# Patient Record
Sex: Female | Born: 1985
Health system: Southern US, Community
[De-identification: ages and names within clinical notes are randomized; demographics above are authoritative.]

## PROBLEM LIST (undated history)

## (undated) ENCOUNTER — Inpatient Hospital Stay: Payer: Self-pay

## (undated) DIAGNOSIS — N840 Polyp of corpus uteri: Secondary | ICD-10-CM

## (undated) DIAGNOSIS — N390 Urinary tract infection, site not specified: Secondary | ICD-10-CM

## (undated) DIAGNOSIS — B379 Candidiasis, unspecified: Secondary | ICD-10-CM

## (undated) HISTORY — DX: Polyp of corpus uteri: N84.0

## (undated) HISTORY — PX: DILATION AND CURETTAGE OF UTERUS: SHX78

## (undated) HISTORY — DX: Candidiasis, unspecified: B37.9

## (undated) HISTORY — DX: Urinary tract infection, site not specified: N39.0

---

## 2015-10-05 ENCOUNTER — Encounter: Payer: Self-pay | Admitting: Obstetrics and Gynecology

## 2015-10-18 ENCOUNTER — Ambulatory Visit (INDEPENDENT_AMBULATORY_CARE_PROVIDER_SITE_OTHER): Payer: 59 | Admitting: Obstetrics and Gynecology

## 2015-10-18 ENCOUNTER — Encounter: Payer: Self-pay | Admitting: Obstetrics and Gynecology

## 2015-10-18 VITALS — BP 113/75 | HR 74 | Ht 59.0 in | Wt 113.9 lb

## 2015-10-18 DIAGNOSIS — Z01419 Encounter for gynecological examination (general) (routine) without abnormal findings: Secondary | ICD-10-CM | POA: Diagnosis not present

## 2015-10-18 DIAGNOSIS — E282 Polycystic ovarian syndrome: Secondary | ICD-10-CM | POA: Diagnosis not present

## 2015-10-18 DIAGNOSIS — Z3041 Encounter for surveillance of contraceptive pills: Secondary | ICD-10-CM

## 2015-10-18 MED ORDER — DROSPIRENONE-ETHINYL ESTRADIOL 3-0.03 MG PO TABS: 1.0000 | ORAL_TABLET | Freq: Every day | ORAL | 3 refills | Status: DC

## 2015-10-18 NOTE — Progress Notes (Signed)
ANNUAL PREVENTATIVE CARE GYN  ENCOUNTER NOTE  Subjective:       Darlene Ellis is a 30 y.o. G0P0000 female here for a routine annual gynecologic exam.  Current complaints: 1.  Need to change ocp? Currently on mononessa Discuss other contraceptions   Recent [place] Contemplation of pregnancy early next year Patient reports history of polycystic ovaries from ultrasound in association with irregular menstrual cycles; patient does not report any androgen excess symptoms. No history of glucose intolerance. Current OCP regimen is notable for mild dysmenorrhea; previous OCP containing desogestrel resulted in controlling significant cramping Patient has history of D&C for abnormal uterine bleeding; pathology notable for endometrial polyp    Gynecologic History Patient's last menstrual period was 10/13/2015 (exact date). Contraception: OCP (estrogen/progesterone) Last Pap: 2015. Results were: normal Last mammogram: n/a. Results were:   Obstetric History OB History  Gravida Para Term Preterm AB Living  0 0 0 0 0 0  SAB TAB Ectopic Multiple Live Births  0 0 0 0 0        Past Medical History:  Diagnosis Date  . Endometrial polyp   . UTI (lower urinary tract infection)   . Yeast infection     Past Surgical History:  Procedure Laterality Date  . DILATION AND CURETTAGE OF UTERUS      No current outpatient prescriptions on file prior to visit.   No current facility-administered medications on file prior to visit.     No Known Allergies  Social History   Social History  . Marital status: Married    Spouse name: N/A  . Number of children: N/A  . Years of education: N/A   Occupational History  . Not on file.   Social History Main Topics  . Smoking status: Not on file  . Smokeless tobacco: Not on file  . Alcohol use Not on file  . Drug use: Unknown  . Sexual activity: Yes    Birth control/ protection: Pill   Other Topics Concern  . Not on file    Social History Narrative  . No narrative on file    Family History  Problem Relation Age of Onset  . Hypertension Father   . Ovarian cancer Maternal Aunt   . Colon cancer Maternal Aunt   . Diabetes Paternal Grandmother   . Breast cancer Neg Hx     The following portions of the patient's history were reviewed and updated as appropriate: allergies, current medications, past family history, past medical history, past social history, past surgical history and problem list.  Review of Systems ROS Review of Systems - General ROS: negative for - chills, fatigue, fever, hot flashes, night sweats, weight gain or weight loss Psychological ROS: negative for - anxiety, decreased libido, depression, mood swings, physical abuse or sexual abuse Ophthalmic ROS: negative for - blurry vision, eye pain or loss of vision ENT ROS: negative for - headaches, hearing change, visual changes or vocal changes Allergy and Immunology ROS: negative for - hives, itchy/watery eyes or seasonal allergies Hematological and Lymphatic ROS: negative for - bleeding problems, bruising, swollen lymph nodes or weight loss Endocrine ROS: negative for - galactorrhea, hair pattern changes, hot flashes, malaise/lethargy, mood swings, palpitations, polydipsia/polyuria, skin changes, temperature intolerance or unexpected weight changes Breast ROS: negative for - new or changing breast lumps or nipple discharge Respiratory ROS: negative for - cough or shortness of breath Cardiovascular ROS: negative for - chest pain, irregular heartbeat, palpitations or shortness of breath Gastrointestinal ROS: no abdominal  pain, change in bowel habits, or black or bloody stools Genito-Urinary ROS: no dysuria, trouble voiding, or hematuria Musculoskeletal ROS: negative for - joint pain or joint stiffness Neurological ROS: negative for - bowel and bladder control changes Dermatological ROS: negative for rash and skin lesion changes   Objective:    BP 113/75   Pulse 74   Ht 4\' 11"  (1.499 m)   Wt 113 lb 14.4 oz (51.7 kg)   LMP 10/13/2015 (Exact Date)   BMI 23.01 kg/m  CONSTITUTIONAL: Well-developed, well-nourished female in no acute distress.  PSYCHIATRIC: Normal mood and affect. Normal behavior. Normal judgment and thought content. NEUROLGIC: Alert and oriented to person, place, and time. Normal muscle tone coordination. No cranial nerve deficit noted. HENT:  Normocephalic, atraumatic, External right and left ear normal. Oropharynx is clear and moist EYES: Conjunctivae and EOM are normal. No scleral icterus.  NECK: Normal range of motion, supple, no masses.  Normal thyroid.  SKIN: Skin is warm and dry. No rash noted. Not diaphoretic. No erythema. No pallor. CARDIOVASCULAR: Normal heart rate noted, regular rhythm, no murmur. RESPIRATORY: Clear to auscultation bilaterally. Effort and breath sounds normal, no problems with respiration noted. BREASTS: Symmetric in size. No masses, skin changes, nipple drainage, or lymphadenopathy. ABDOMEN: Soft, normal bowel sounds, no distention noted.  No tenderness, rebound or guarding.  BLADDER: Normal PELVIC:  External Genitalia: Normal  BUS: Normal  Vagina: Normal  Cervix: Normal; nulliparous; no cervical motion tenderness  Uterus: Normal; midplane, normal size and shape, mobile  Adnexa: Normal  RV: External Exam NormaI  MUSCULOSKELETAL: Normal range of motion. No tenderness.  No cyanosis, clubbing, or edema.  2+ distal pulses. LYMPHATIC: No Axillary, Supraclavicular, or Inguinal Adenopathy.    Assessment:   Annual gynecologic examination 30 y.o. Contraception: OCP (estrogen/progesterone) Normal BMI Problem List Items Addressed This Visit    None    Visit Diagnoses   None.     Plan:  Pap: Pap, Reflex if ASCUS Mammogram: Not Indicated Stool Guaiac Testing:  Not Indicated Labs: lipid vitd a1c fbs vit d Routine preventative health maintenance measures emphasized:  Exercise/Diet/Weight control, Tobacco Warnings, Alcohol/Substance use risks and Safe Sex Prescription for desogestrel-containing OCP-Ocella Begin multivitamin daily Prior to attempting conception, discontinue OCPs for 3 months and perform menstrual calendar monitoring Return to Clinic - 1 Year   Crystal EvansburgMiller, CMA  Herold HarmsMartin A Edwar Coe, MD  Note: This dictation was prepared with Dragon dictation along with smaller phrase technology. Any transcriptional errors that result from this process are unintentional.

## 2015-10-18 NOTE — Patient Instructions (Signed)
1. Pap smear 2. Self breast awareness 3. Continue with healthy eating and exercise 4. Preconception recommendations reviewed 5. Begin multivitamin daily 6. Prescription for desogestrel-containing OCP is given-Ocella 7. Maintain menstrual calendar monitoring for 3 months prior to attempting to conceive (after discontinuation of OCPs) 8. Return in 1 year

## 2015-10-20 LAB — PAP IG W/ RFLX HPV ASCU: PAP Smear Comment: 0

## 2016-01-26 ENCOUNTER — Other Ambulatory Visit: Payer: BLUE CROSS/BLUE SHIELD

## 2016-01-26 DIAGNOSIS — Z01419 Encounter for gynecological examination (general) (routine) without abnormal findings: Secondary | ICD-10-CM | POA: Diagnosis not present

## 2016-01-27 LAB — LIPID PANEL
Chol/HDL Ratio: 2.4 ratio units (ref 0.0–4.4)
Cholesterol, Total: 204 mg/dL — ABNORMAL HIGH (ref 100–199)
HDL: 85 mg/dL (ref >39)
LDL Calculated: 92 mg/dL (ref 0–99)
Triglycerides: 134 mg/dL (ref 0–149)
VLDL Cholesterol Cal: 27 mg/dL (ref 5–40)

## 2016-01-27 LAB — TSH: TSH: 3.32 u[IU]/mL (ref 0.450–4.500)

## 2016-01-27 LAB — GLUCOSE, RANDOM: Glucose: 93 mg/dL (ref 65–99)

## 2016-01-27 LAB — HEMOGLOBIN A1C
Est. average glucose Bld gHb Est-mCnc: 97 mg/dL
Hgb A1c MFr Bld: 5 % (ref 4.8–5.6)

## 2016-01-27 LAB — VITAMIN D 25 HYDROXY (VIT D DEFICIENCY, FRACTURES): Vit D, 25-Hydroxy: 28.4 ng/mL — ABNORMAL LOW (ref 30.0–100.0)

## 2016-01-30 NOTE — L&D Delivery Note (Signed)
       Delivery Note   [name] is a 31 y.o. G1P1001 at 4682w2d Estimated Date of Delivery: 01/12/17  PRE-OPERATIVE DIAGNOSIS:  1) 5882w2d pregnancy. 2)  Second stage FHR decelerations  3)  Poor maternal expulsive effort  POST-OPERATIVE DIAGNOSIS:  1) 482w2d pregnancy s/p Vaginal, Vacuum (Extractor)   2) Nuchal cord X1  Delivery Type: Vaginal, Vacuum (Extractor)    Delivery Anesthesia: Epidural   Labor Complications:   Second stage decelerations    ESTIMATED BLOOD LOSS: 125  ml    FINDINGS:   1) female infant, Apgar scores of 8    at 1 minute and 9   at 5 minutes 2) Nuchal cord: Yes  SPECIMENS:   PLACENTA:   Appearance: Intact    Removal: Spontaneous      Disposition:     DISPOSITION:  Infant to left in stable condition in the delivery room, with L&D personnel and mother,   NARRATIVE SUMMARY: Labor course:  [name] is a G1P1001 at 6882w2d who presented for labor management.  She progressed well in labor with pitocin.  She received the appropriate anesthesia and proceeded to complete dilation. She evidenced poor maternal expulsive effort during the second stage.  Epidural was turned off but pt could not quite get her effort to translate to descent.   A vacuum was deemed indicated for decelerations and lack of fetal descent.  After discussion with pt and husband I performed a vacuum assisted delivery.  The head was on the pelvic floor and adequate pelvic room was found after examination.  The kiwi was placed and over the next several contractions traction was applied with descent after each pull.  An episiotomy was indicated and after use of local anes a midline epis was cut.  The fetal vertex delivered on the next pull. She went on to deliver a viable infant.  The baby cried spontaneously.   The placenta delivered without problems and was noted to be complete. A perineal and vaginal examination was performed. Lacerations: None  Episiotomy  was repaired with Vicryl  suture using local anesthesia. The patient tolerated this well.  Elonda Huskyavid J. Emmanuelle Coxe, M.D. 01/07/2017 12:10 PM

## 2016-02-20 DIAGNOSIS — R112 Nausea with vomiting, unspecified: Secondary | ICD-10-CM | POA: Diagnosis not present

## 2016-02-20 DIAGNOSIS — R1013 Epigastric pain: Secondary | ICD-10-CM | POA: Diagnosis not present

## 2016-02-20 DIAGNOSIS — R197 Diarrhea, unspecified: Secondary | ICD-10-CM | POA: Diagnosis not present

## 2016-05-01 ENCOUNTER — Ambulatory Visit (INDEPENDENT_AMBULATORY_CARE_PROVIDER_SITE_OTHER): Payer: BLUE CROSS/BLUE SHIELD | Admitting: Obstetrics and Gynecology

## 2016-05-01 ENCOUNTER — Encounter: Payer: Self-pay | Admitting: Obstetrics and Gynecology

## 2016-05-01 VITALS — BP 98/61 | HR 76 | Ht 59.0 in | Wt 112.8 lb

## 2016-05-01 DIAGNOSIS — E282 Polycystic ovarian syndrome: Secondary | ICD-10-CM

## 2016-05-01 NOTE — Patient Instructions (Addendum)
1. Track menstrual cycles with infertility BBT chart 2. Timed intercourse as recommended 3. Return in 3 months for follow-up 4. Continue taking prenatal vitamins 5. Day 22  progesterone test is to be obtained with third cycle

## 2016-05-01 NOTE — Progress Notes (Signed)
Chief complaint: 1. PCO 2. Desires conception  Patient presents today with her spouse for review of menstrual cycles and management planning due to history of all cystic ovary disease.  Patient had been given diagnosis of polycystic ovary disease with lack of a release potentially contributing to infertility (by OB/GYN in the [place] Patient has been on oral contraceptives until December 2017. Intervals since discontinuing birth control pills have been 33 and 31 days respectively.  OBJECTIVE: BP 98/61   Pulse 76   Ht  (1.499 m)   Wt 112 lb 12.8 oz (51.2 kg)   LMP 04/07/2016 (Exact Date)   BMI 22.78 kg/m  Physical exam-deferred  ASSESSMENT: 1. PCO 2. Desires conception 3. Menstrual intervals appeared to be consistent with ovulation  PLAN: 1. Overview of PCO management was completed 2. Patient is to continue with prenatal vitamins daily 3. Patient is to continue menstrual calendar monitoring with a BBT chart 4. Patient is to have timed intercourse as recommended based on most recent menstrual intervals. 5. Patient is to have day 22 progesterone test with her third cycle 6. Patient is to return in 3 months for follow-up and further management planning  A total of 15 minutes were spent face-to-face with the patient during this encounter and over half of that time dealt with counseling and coordination of care.  Herold Harms, MD

## 2016-05-16 ENCOUNTER — Ambulatory Visit (INDEPENDENT_AMBULATORY_CARE_PROVIDER_SITE_OTHER): Payer: BLUE CROSS/BLUE SHIELD | Admitting: Obstetrics and Gynecology

## 2016-05-16 ENCOUNTER — Encounter: Payer: Self-pay | Admitting: Obstetrics and Gynecology

## 2016-05-16 VITALS — BP 95/55 | HR 69 | Ht 59.0 in | Wt 114.2 lb

## 2016-05-16 DIAGNOSIS — N926 Irregular menstruation, unspecified: Secondary | ICD-10-CM

## 2016-05-16 LAB — POCT URINALYSIS DIPSTICK
Bilirubin, UA: NEGATIVE
Blood, UA: NEGATIVE
Glucose, UA: NEGATIVE
Ketones, UA: NEGATIVE
Leukocytes, UA: NEGATIVE
Nitrite, UA: NEGATIVE
Protein, UA: NEGATIVE
Spec Grav, UA: 1.01 (ref 1.010–1.025)
Urobilinogen, UA: NEGATIVE E.U./dL — AB
pH, UA: 5 (ref 5.0–8.0)

## 2016-05-16 LAB — POCT URINE PREGNANCY: Preg Test, Ur: POSITIVE — AB

## 2016-05-16 NOTE — Progress Notes (Signed)
HPI:      Ms. Ivanna Kocak is a 31 y.o. G1P0000 who LMP was Patient's last menstrual period was 04/07/2016 (exact date).  Subjective:   She presents today After missing her menstrual period.  She was attempting pregnancy and is very excited. She is currently taking prenatal vitamins.   Hx: The following portions of the patient's history were reviewed and updated as appropriate:              She  has a past medical history of Endometrial polyp; UTI (lower urinary tract infection); and Yeast infection. She  does not have any pertinent problems on file. She  has a past surgical history that includes Dilation and curettage of uterus. Her family history includes Colon cancer in her maternal aunt; Diabetes in her paternal grandmother; Hypertension in her father; Ovarian cancer in her maternal aunt. She  reports that she has never smoked. She has never used smokeless tobacco. She reports that she does not drink alcohol or use drugs. Current Outpatient Prescriptions on File Prior to Visit  Medication Sig Dispense Refill  . cholecalciferol (VITAMIN D) 1000 units tablet Take 1,000 Units by mouth daily.    . Prenatal Vit-Fe Fumarate-FA (PRENATAL MULTIVITAMIN) TABS tablet Take 1 tablet by mouth daily at 12 noon.     No current facility-administered medications on file prior to visit.          Review of Systems:  Review of Systems  Constitutional: Denied constitutional symptoms, night sweats, recent illness, fatigue, fever, insomnia and weight loss.  Eyes: Denied eye symptoms, eye pain, photophobia, vision change and visual disturbance.  Ears/Nose/Throat/Neck: Denied ear, nose, throat or neck symptoms, hearing loss, nasal discharge, sinus congestion and sore throat.  Cardiovascular: Denied cardiovascular symptoms, arrhythmia, chest pain/pressure, edema, exercise intolerance, orthopnea and palpitations.  Respiratory: Denied pulmonary symptoms, asthma, pleuritic pain, productive sputum, cough, dyspnea  and wheezing.  Gastrointestinal: Denied, gastro-esophageal reflux, melena, nausea and vomiting.  Genitourinary: Denied genitourinary symptoms including symptomatic vaginal discharge, pelvic relaxation issues, and urinary complaints.  Musculoskeletal: Denied musculoskeletal symptoms, stiffness, swelling, muscle weakness and myalgia.  Dermatologic: Denied dermatology symptoms, rash and scar.  Neurologic: Denied neurology symptoms, dizziness, headache, neck pain and syncope.  Psychiatric: Denied psychiatric symptoms, anxiety and depression.  Endocrine: Denied endocrine symptoms including hot flashes and night sweats.   Meds:   Current Outpatient Prescriptions on File Prior to Visit  Medication Sig Dispense Refill  . cholecalciferol (VITAMIN D) 1000 units tablet Take 1,000 Units by mouth daily.    . Prenatal Vit-Fe Fumarate-FA (PRENATAL MULTIVITAMIN) TABS tablet Take 1 tablet by mouth daily at 12 noon.     No current facility-administered medications on file prior to visit.     Objective:     Vitals:   05/16/16 0818  BP: (!) 95/55  Pulse: 69              Physical examination   Pelvic:   Vulva: Normal appearance.  No lesions.  Vagina: No lesions or abnormalities noted.  Support: Normal pelvic support.  Urethra No masses tenderness or scarring.  Meatus Normal size without lesions or prolapse.  Cervix: Normal appearance.  No lesions.  Anus: Normal exam.  No lesions.  Perineum: Normal exam.  No lesions.        Bimanual   Uterus: Enlarged 6wks  Non-tender.  Mobile.  AV.  Adnexae: No masses.  Non-tender to palpation.  Cul-de-sac: Negative for abnormality.     Assessment:    G1P0000 Patient Active  Problem List   Diagnosis Date Noted  . Encounter for surveillance of contraceptive pills 10/18/2015  . PCO (polycystic ovaries) 10/18/2015     1. Missed menses     Patient with positive pregnancy test and enlarged uterus consistent with pregnancy.   Plan:             Prenatal Plan 1.  The patient was given prenatal literature. 2.  She was continued on prenatal vitamins. 3.  A prenatal lab panel was ordered or drawn. 4.  An ultrasound was ordered to better determine an EDC. 5.  A nurse visit was scheduled. 6.  Pap GC/CT performed with pelvic exam.  Orders Orders Placed This Encounter  Procedures  . POCT urine pregnancy    No orders of the defined types were placed in this encounter.       F/U  Return in about 6 weeks (around 06/27/2016).  Elonda Husky, M.D. 05/16/2016 9:27 AM

## 2016-05-17 LAB — CBC WITH DIFFERENTIAL
Basophils Absolute: 0 10*3/uL (ref 0.0–0.2)
Basos: 0 %
EOS (ABSOLUTE): 0.1 10*3/uL (ref 0.0–0.4)
Eos: 1 %
Hematocrit: 41.7 % (ref 34.0–46.6)
Hemoglobin: 13.8 g/dL (ref 11.1–15.9)
Immature Grans (Abs): 0 10*3/uL (ref 0.0–0.1)
Immature Granulocytes: 0 %
Lymphocytes Absolute: 2 10*3/uL (ref 0.7–3.1)
Lymphs: 31 %
MCH: 29.6 pg (ref 26.6–33.0)
MCHC: 33.1 g/dL (ref 31.5–35.7)
MCV: 89 fL (ref 79–97)
Monocytes Absolute: 0.5 10*3/uL (ref 0.1–0.9)
Monocytes: 7 %
Neutrophils Absolute: 4 10*3/uL (ref 1.4–7.0)
Neutrophils: 61 %
RBC: 4.67 x10E6/uL (ref 3.77–5.28)
RDW: 13.8 % (ref 12.3–15.4)
WBC: 6.6 10*3/uL (ref 3.4–10.8)

## 2016-05-17 LAB — URINALYSIS, ROUTINE W REFLEX MICROSCOPIC
Bilirubin, UA: NEGATIVE
Glucose, UA: NEGATIVE
Ketones, UA: NEGATIVE
Leukocytes, UA: NEGATIVE
Nitrite, UA: NEGATIVE
Protein, UA: NEGATIVE
RBC, UA: NEGATIVE
Specific Gravity, UA: <=1.005 — AB (ref 1.005–1.030)
Urobilinogen, Ur: 0.2 mg/dL (ref 0.2–1.0)
pH, UA: 6.5 (ref 5.0–7.5)

## 2016-05-17 LAB — MONITOR DRUG PROFILE 14(MW)
Amphetamine Scrn, Ur: NEGATIVE ng/mL
BARBITURATE SCREEN URINE: NEGATIVE ng/mL
BENZODIAZEPINE SCREEN, URINE: NEGATIVE ng/mL
Buprenorphine, Urine: NEGATIVE ng/mL
CANNABINOIDS UR QL SCN: NEGATIVE ng/mL
Cocaine (Metab) Scrn, Ur: NEGATIVE ng/mL
Creatinine(Crt), U: 11.8 mg/dL — ABNORMAL LOW (ref 20.0–300.0)
Fentanyl, Urine: NEGATIVE pg/mL
Meperidine Screen, Urine: NEGATIVE ng/mL
Methadone Screen, Urine: NEGATIVE ng/mL
OXYCODONE+OXYMORPHONE UR QL SCN: NEGATIVE ng/mL
Opiate Scrn, Ur: NEGATIVE ng/mL
Ph of Urine: 6 (ref 4.5–8.9)
Phencyclidine Qn, Ur: NEGATIVE ng/mL
Propoxyphene Scrn, Ur: NEGATIVE ng/mL
SPECIFIC GRAVITY: 1.002
Tramadol Screen, Urine: NEGATIVE ng/mL

## 2016-05-17 LAB — HIV ANTIBODY (ROUTINE TESTING W REFLEX): HIV Screen 4th Generation wRfx: NONREACTIVE

## 2016-05-17 LAB — RUBELLA SCREEN: Rubella Antibodies, IGG: 9.73 index (ref >0.99)

## 2016-05-17 LAB — ABO AND RH: Rh Factor: POSITIVE

## 2016-05-17 LAB — VARICELLA ZOSTER ANTIBODY, IGG: Varicella zoster IgG: 1854 index (ref >165)

## 2016-05-17 LAB — RPR: RPR Ser Ql: NONREACTIVE

## 2016-05-17 LAB — NICOTINE SCREEN, URINE: Cotinine Ql Scrn, Ur: NEGATIVE ng/mL

## 2016-05-17 LAB — HEPATITIS B SURFACE ANTIGEN: Hepatitis B Surface Ag: NEGATIVE

## 2016-05-17 LAB — ANTIBODY SCREEN: Antibody Screen: NEGATIVE

## 2016-05-18 LAB — GC/CHLAMYDIA PROBE AMP
Chlamydia trachomatis, NAA: NEGATIVE
Neisseria gonorrhoeae by PCR: NEGATIVE

## 2016-05-21 ENCOUNTER — Telehealth: Payer: Self-pay | Admitting: Obstetrics and Gynecology

## 2016-05-21 NOTE — Telephone Encounter (Signed)
Please call laboratory to confirm the patients gestation age needed for the maternity 21 testing - it may be too early for this test  Please call Vikki Ports @ [phone]

## 2016-05-22 LAB — REQUEST PROBLEM

## 2016-05-22 NOTE — Telephone Encounter (Signed)
Returned Darlene Ellis's call 05/21/16. She had left for the day so I spoke with another person and it is too early for the test to be run. She will discard that specimen. There will be no charge for pt.Message left on pts voicemail explaining my error and assured pt we will redraw after 9 weeks.

## 2016-05-30 ENCOUNTER — Ambulatory Visit (INDEPENDENT_AMBULATORY_CARE_PROVIDER_SITE_OTHER): Payer: BLUE CROSS/BLUE SHIELD

## 2016-05-30 DIAGNOSIS — N926 Irregular menstruation, unspecified: Secondary | ICD-10-CM

## 2016-06-05 ENCOUNTER — Telehealth: Payer: Self-pay | Admitting: Certified Nurse Midwife

## 2016-06-05 NOTE — Telephone Encounter (Signed)
Patient's husband lvm stating Patient needs appointment moved back further in the schedule. I lvm to reschedule appointment.

## 2016-06-22 ENCOUNTER — Ambulatory Visit (INDEPENDENT_AMBULATORY_CARE_PROVIDER_SITE_OTHER): Payer: BLUE CROSS/BLUE SHIELD | Admitting: Obstetrics and Gynecology

## 2016-06-22 VITALS — BP 98/59 | HR 81 | Ht 59.0 in | Wt 113.9 lb

## 2016-06-22 DIAGNOSIS — Z3401 Encounter for supervision of normal first pregnancy, first trimester: Secondary | ICD-10-CM

## 2016-06-22 DIAGNOSIS — O219 Vomiting of pregnancy, unspecified: Secondary | ICD-10-CM

## 2016-06-22 NOTE — Patient Instructions (Signed)
Morning Sickness Morning sickness is when you feel sick to your stomach (nauseous) during pregnancy. You may feel sick to your stomach and throw up (vomit). You may feel sick in the morning, but you can feel this way any time of day. Some women feel very sick to their stomach and cannot stop throwing up (hyperemesis gravidarum). Follow these instructions at home:  Only take medicines as told by your doctor.  Take multivitamins as told by your doctor. Taking multivitamins before getting pregnant can stop or lessen the harshness of morning sickness.  Eat dry toast or unsalted crackers before getting out of bed.  Eat 5 to 6 small meals a day.  Eat dry and bland foods like rice and baked potatoes.  Do not drink liquids with meals. Drink between meals.  Do not eat greasy, fatty, or spicy foods.  Have someone cook for you if the smell of food causes you to feel sick or throw up.  If you feel sick to your stomach after taking prenatal vitamins, take them at night or with a snack.  Eat protein when you need a snack (nuts, yogurt, cheese).  Eat unsweetened gelatins for dessert.  Wear a bracelet used for sea sickness (acupressure wristband).  Go to a doctor that puts thin needles into certain body points (acupuncture) to improve how you feel.  Do not smoke.  Use a humidifier to keep the air in your house free of odors.  Get lots of fresh air. Contact a doctor if:  You need medicine to feel better.  You feel dizzy or lightheaded.  You are losing weight. Get help right away if:  You feel very sick to your stomach and cannot stop throwing up.  You pass out (faint). This information is not intended to replace advice given to you by your health care provider. Make sure you discuss any questions you have with your health care provider. Document Released: 02/23/2004 Document Revised: 06/23/2015 Document Reviewed: 07/02/2012 Elsevier Interactive Patient Education  2017 [place] of Pregnancy The first trimester of pregnancy is from week 1 until the end of week 13 (months 1 through 3). A week after a sperm fertilizes an egg, the egg will implant on the wall of the uterus. This embryo will begin to develop into a baby. Genes from you and your partner will form the baby. The female genes will determine whether the baby will be a boy or a girl. At 6-8 weeks, the eyes and face will be formed, and the heartbeat can be seen on ultrasound. At the end of 12 weeks, all the baby's organs will be formed. Now that you are pregnant, you will want to do everything you can to have a healthy baby. Two of the most important things are to get good prenatal care and to follow your health care provider's instructions. Prenatal care is all the medical care you receive before the baby's birth. This care will help prevent, find, and treat any problems during the pregnancy and childbirth. Body changes during your first trimester Your body goes through many changes during pregnancy. The changes vary from woman to woman.  You may gain or lose a couple of pounds at first.  You may feel sick to your stomach (nauseous) and you may throw up (vomit). If the vomiting is uncontrollable, call your health care provider.  You may tire easily.  You may develop headaches that can be relieved by medicines. All medicines should be approved by your health  care provider.  You may urinate more often. Painful urination may mean you have a bladder infection.  You may develop heartburn as a result of your pregnancy.  You may develop constipation because certain hormones are causing the muscles that push stool through your intestines to slow down.  You may develop hemorrhoids or swollen veins (varicose veins).  Your breasts may begin to grow larger and become tender. Your nipples may stick out more, and the tissue that surrounds them (areola) may become darker.  Your gums may bleed and may be  sensitive to brushing and flossing.  Dark spots or blotches (chloasma, mask of pregnancy) may develop on your face. This will likely fade after the baby is born.  Your menstrual periods will stop.  You may have a loss of appetite.  You may develop cravings for certain kinds of food.  You may have changes in your emotions from day to day, such as being excited to be pregnant or being concerned that something may go wrong with the pregnancy and baby.  You may have more vivid and strange dreams.  You may have changes in your hair. These can include thickening of your hair, rapid growth, and changes in texture. Some women also have hair loss during or after pregnancy, or hair that feels dry or thin. Your hair will most likely return to normal after your baby is born. What to expect at prenatal visits During a routine prenatal visit:  You will be weighed to make sure you and the baby are growing normally.  Your blood pressure will be taken.  Your abdomen will be measured to track your baby's growth.  The fetal heartbeat will be listened to between weeks 10 and 14 of your pregnancy.  Test results from any previous visits will be discussed. Your health care provider may ask you:  How you are feeling.  If you are feeling the baby move.  If you have had any abnormal symptoms, such as leaking fluid, bleeding, severe headaches, or abdominal cramping.  If you are using any tobacco products, including cigarettes, chewing tobacco, and electronic cigarettes.  If you have any questions. Other tests that may be performed during your first trimester include:  Blood tests to find your blood type and to check for the presence of any previous infections. The tests will also be used to check for low iron levels (anemia) and protein on red blood cells (Rh antibodies). Depending on your risk factors, or if you previously had diabetes during pregnancy, you may have tests to check for high blood sugar  that affects pregnant women (gestational diabetes).  Urine tests to check for infections, diabetes, or protein in the urine.  An ultrasound to confirm the proper growth and development of the baby.  Fetal screens for spinal cord problems (spina bifida) and Down syndrome.  HIV (human immunodeficiency virus) testing. Routine prenatal testing includes screening for HIV, unless you choose not to have this test.  You may need other tests to make sure you and the baby are doing well. Follow these instructions at home: Medicines   Follow your health care provider's instructions regarding medicine use. Specific medicines may be either safe or unsafe to take during pregnancy.  Take a prenatal vitamin that contains at least 600 micrograms (mcg) of folic acid.  If you develop constipation, try taking a stool softener if your health care provider approves. Eating and drinking   Eat a balanced diet that includes fresh fruits and vegetables, whole grains,  good sources of protein such as meat, eggs, or tofu, and low-fat dairy. Your health care provider will help you determine the amount of weight gain that is right for you.  Avoid raw meat and uncooked cheese. These carry germs that can cause birth defects in the baby.  Eating four or five small meals rather than three large meals a day may help relieve nausea and vomiting. If you start to feel nauseous, eating a few soda crackers can be helpful. Drinking liquids between meals, instead of during meals, also seems to help ease nausea and vomiting.  Limit foods that are high in fat and processed sugars, such as fried and sweet foods.  To prevent constipation:  Eat foods that are high in fiber, such as fresh fruits and vegetables, whole grains, and beans.  Drink enough fluid to keep your urine clear or pale yellow. Activity   Exercise only as directed by your health care provider. Most women can continue their usual exercise routine during  pregnancy. Try to exercise for 30 minutes at least 5 days a week. Exercising will help you:  Control your weight.  Stay in shape.  Be prepared for labor and delivery.  Experiencing pain or cramping in the lower abdomen or lower back is a good sign that you should stop exercising. Check with your health care provider before continuing with normal exercises.  Try to avoid standing for long periods of time. Move your legs often if you must stand in one place for a long time.  Avoid heavy lifting.  Wear low-heeled shoes and practice good posture.  You may continue to have sex unless your health care provider tells you not to. Relieving pain and discomfort   Wear a good support bra to relieve breast tenderness.  Take warm sitz baths to soothe any pain or discomfort caused by hemorrhoids. Use hemorrhoid cream if your health care provider approves.  Rest with your legs elevated if you have leg cramps or low back pain.  If you develop varicose veins in your legs, wear support hose. Elevate your feet for 15 minutes, 3-4 times a day. Limit salt in your diet. Prenatal care   Schedule your prenatal visits by the twelfth week of pregnancy. They are usually scheduled monthly at first, then more often in the last 2 months before delivery.  Write down your questions. Take them to your prenatal visits.  Keep all your prenatal visits as told by your health care provider. This is important. Safety   Wear your seat belt at all times when driving.  Make a list of emergency phone numbers, including numbers for family, friends, the hospital, and police and fire departments. General instructions   Ask your health care provider for a referral to a local prenatal education class. Begin classes no later than the beginning of month 6 of your pregnancy.  Ask for help if you have counseling or nutritional needs during pregnancy. Your health care provider can offer advice or refer you to specialists for  help with various needs.  Do not use hot tubs, steam rooms, or saunas.  Do not douche or use tampons or scented sanitary pads.  Do not cross your legs for long periods of time.  Avoid cat litter boxes and soil used by cats. These carry germs that can cause birth defects in the baby and possibly loss of the fetus by miscarriage or stillbirth.  Avoid all smoking, herbs, alcohol, and medicines not prescribed by your health care provider. Chemicals  in these products affect the formation and growth of the baby.  Do not use any products that contain nicotine or tobacco, such as cigarettes and e-cigarettes. If you need help quitting, ask your health care provider. You may receive counseling support and other resources to help you quit.  Schedule a dentist appointment. At home, brush your teeth with a soft toothbrush and be gentle when you floss. Contact a health care provider if:  You have dizziness.  You have mild pelvic cramps, pelvic pressure, or nagging pain in the abdominal area.  You have persistent nausea, vomiting, or diarrhea.  You have a bad smelling vaginal discharge.  You have pain when you urinate.  You notice increased swelling in your face, hands, legs, or ankles.  You are exposed to fifth disease or chickenpox.  You are exposed to [place] measles (rubella) and have never had it. Get help right away if:  You have a fever.  You are leaking fluid from your vagina.  You have spotting or bleeding from your vagina.  You have severe abdominal cramping or pain.  You have rapid weight gain or loss.  You vomit blood or material that looks like coffee grounds.  You develop a severe headache.  You have shortness of breath.  You have any kind of trauma, such as from a fall or a car accident. Summary  The first trimester of pregnancy is from week 1 until the end of week 13 (months 1 through 3).  Your body goes through many changes during pregnancy. The changes vary  from woman to woman.  You will have routine prenatal visits. During those visits, your health care provider will examine you, discuss any test results you may have, and talk with you about how you are feeling. This information is not intended to replace advice given to you by your health care provider. Make sure you discuss any questions you have with your health care provider. Document Released: 01/09/2001 Document Revised: 12/28/2015 Document Reviewed: 12/28/2015 Elsevier Interactive Patient Education  2017 [place]. Commonly Asked Questions During Pregnancy  Cats: A parasite can be excreted in cat feces.  To avoid exposure you need to have another person empty the little box.  If you must empty the litter box you will need to wear gloves.  Wash your hands after handling your cat.  This parasite can also be found in raw or undercooked meat so this should also be avoided.  Colds, Sore Throats, Flu: Please check your medication sheet to see what you can take for symptoms.  If your symptoms are unrelieved by these medications please call the office.  Dental Work: Most any dental work Investment banker, corporate recommends is permitted.  X-rays should only be taken during the first trimester if absolutely necessary.  Your abdomen should be shielded with a lead apron during all x-rays.  Please notify your provider prior to receiving any x-rays.  Novocaine is fine; gas is not recommended.  If your dentist requires a note from [place] prior to dental work please call the office and we will provide one for you.  Exercise: Exercise is an important part of staying healthy during your pregnancy.  You may continue most exercises you were accustomed to prior to pregnancy.  Later in your pregnancy you will most likely notice you have difficulty with activities requiring balance like riding a bicycle.  It is important that you listen to your body and avoid activities that put you at a higher risk of falling.  Adequate rest and  staying well hydrated are a must!  If you have questions about the safety of specific activities ask your provider.    Exposure to Children with illness: Try to avoid obvious exposure; report any symptoms to [place] when noted,  If you have chicken pos, red measles or mumps, you should be immune to these diseases.   Please do not take any vaccines while pregnant unless you have checked with your OB provider.  Fetal Movement: After 28 weeks we recommend you do "kick counts" twice daily.  Lie or sit down in a calm quiet environment and count your baby movements "kicks".  You should feel your baby at least 10 times per hour.  If you have not felt 10 kicks within the first hour get up, walk around and have something sweet to eat or drink then repeat for an additional hour.  If count remains less than 10 per hour notify your provider.  Fumigating: Follow your pest control agent's advice as to how long to stay out of your home.  Ventilate the area well before re-entering.  Hemorrhoids:   Most over-the-counter preparations can be used during pregnancy.  Check your medication to see what is safe to use.  It is important to use a stool softener or fiber in your diet and to drink lots of liquids.  If hemorrhoids seem to be getting worse please call the office.   Hot Tubs:  Hot tubs Jacuzzis and saunas are not recommended while pregnant.  These increase your internal body temperature and should be avoided.  Intercourse:  Sexual intercourse is safe during pregnancy as long as you are comfortable, unless otherwise advised by your provider.  Spotting may occur after intercourse; report any bright red bleeding that is heavier than spotting.  Labor:  If you know that you are in labor, please go to the hospital.  If you are unsure, please call the office and let us help you decide what to do.  Lifting, straining, etc:  If your job requires heavy lifting or straining please check with your provider for any limitations.   Generally, you should not lift items heavier than that you can lift simply with your hands and arms (no back muscles)  Painting:  Paint fumes do not harm your pregnancy, but may make you ill and should be avoided if possible.  Latex or water based paints have less odor than oils.  Use adequate ventilation while painting.  Permanents & Hair Color:  Chemicals in hair dyes are not recommended as they cause increase hair dryness which can increase hair loss during pregnancy.  " Highlighting" and permanents are allowed.  Dye may be absorbed differently and permanents may not hold as well during pregnancy.  Sunbathing:  Use a sunscreen, as skin burns easily during pregnancy.  Drink plenty of fluids; avoid over heating.  Tanning Beds:  Because their possible side effects are still unknown, tanning beds are not recommended.  Ultrasound Scans:  Routine ultrasounds are performed at approximately 20 weeks.  You will be able to see your baby's general anatomy an if you would like to know the gender this can usually be determined as well.  If it is questionable when you conceived you may also receive an ultrasound early in your pregnancy for dating purposes.  Otherwise ultrasound exams are not routinely performed unless there is a medical necessity.  Although you can request a scan we ask that you pay for it when conducted because insurance does  not cover " patient request" scans.  Work: If your pregnancy proceeds without complications you may work until your due date, unless your physician or employer advises otherwise.  Round Ligament Pain/Pelvic Discomfort:  Sharp, shooting pains not associated with bleeding are fairly common, usually occurring in the second trimester of pregnancy.  They tend to be worse when standing up or when you remain standing for long periods of time.  These are the result of pressure of certain pelvic ligaments called "round ligaments".  Rest, Tylenol and heat seem to be the most  effective relief.  As the womb and fetus grow, they rise out of the pelvis and the discomfort improves.  Please notify the office if your pain seems different than that described.  It may represent a more serious condition.  Common Medications Safe in Pregnancy  Acne:      Constipation:  Benzoyl Peroxide     Colace  Clindamycin      Dulcolax Suppository  Topica Erythromycin     Fibercon  Salicylic Acid      Metamucil         Miralax AVOID:        Senakot   Accutane    Cough:  Retin-A       Cough Drops  Tetracycline      Phenergan w/ Codeine if Rx  Minocycline      Robitussin (Plain & DM)  Antibiotics:     Crabs/Lice:  Ceclor       RID  Cephalosporins    AVOID:  E-Mycins      Kwell  Keflex  Macrobid/Macrodantin   Diarrhea:  Penicillin      Kao-Pectate  Zithromax      Imodium AD         PUSH FLUIDS AVOID:       Cipro     Fever:  Tetracycline      Tylenol (Regular or Extra  Minocycline       Strength)  Levaquin      Extra Strength-Do not          Exceed 8 tabs/24 hrs Caffeine:        <277m/day (equiv. To 1 cup of coffee or  approx. 3 12 oz sodas)         Gas: Cold/Hayfever:       Gas-X  Benadryl      Mylicon  Claritin       Phazyme  **Claritin-D        Chlor-Trimeton    Headaches:  Dimetapp      ASA-Free Excedrin  Drixoral-Non-Drowsy     Cold Compress  Mucinex (Guaifenasin)     Tylenol (Regular or Extra  Sudafed/Sudafed-12 Hour     Strength)  **Sudafed PE Pseudoephedrine   Tylenol Cold & Sinus     Vicks Vapor Rub  Zyrtec  **AVOID if Problems With Blood Pressure         Heartburn: Avoid lying down for at least 1 hour after meals  Aciphex      Maalox     Rash:  Milk of Magnesia     Benadryl    Mylanta       1% Hydrocortisone Cream  Pepcid  Pepcid Complete   Sleep Aids:  Prevacid      Ambien   Prilosec       Benadryl  Rolaids       Chamomile Tea  Tums (Limit 4/day)     Unisom  Zantac  Tylenol PM         Warm milk-add vanilla  or  Hemorrhoids:       Sugar for taste  Anusol/Anusol H.C.  (RX: Analapram 2.5%)  Sugar Substitutes:  Hydrocortisone OTC     Ok in moderation  Preparation H      Tucks        Vaseline lotion applied to tissue with wiping    Herpes:     Throat:  Acyclovir      Oragel  Famvir  Valtrex     Vaccines:         Flu Shot Leg Cramps:       *Gardasil  Benadryl      Hepatitis A         Hepatitis B Nasal Spray:       Pneumovax  Saline Nasal Spray     Polio Booster         Tetanus Nausea:       Tuberculosis test or PPD  Vitamin B6 25 mg TID   AVOID:    Dramamine      *Gardasil  Emetrol       Live Poliovirus  Ginger Root 250 mg QID    MMR (measles, mumps &  High Complex Carbs @ Bedtime    rebella)  Sea Bands-Accupressure    Varicella (Chickenpox)  Unisom 1/2 tab TID     *No known complications           If received before Pain:         Known pregnancy;   Darvocet       Resume series after  Lortab        Delivery  Percocet    Yeast:   Tramadol      Femstat  Tylenol 3      Gyne-lotrimin  Ultram       Monistat  Vicodin           MISC:         All Sunscreens           Hair Coloring/highlights          Insect Repellant's          (Including DEET)         Mystic Tans

## 2016-06-22 NOTE — Progress Notes (Signed)
Darlene Ellis presents for NOB nurse interview visit. Pregnancy confirmation done _4/18/2018 with DJE.  G-1 .  P-0 .  Dating scan on 05/30/2016 [facility]- 6 5/7 with EDD of 01/12/2017.  During confirmation appt- DJE ordered all nob labs including Maternit 21. Pt was too earlier for Maternit 21 at that time. Offered today and pt declined. Pap noted to be ordered at confirmation visit  but I do not see it in labs. Last pap 09/2015 neg by mad. Mild nausea noted. Advised B6 25 mg q6 and 1/2 tab Unisom qhs. Nausea protocol explained. Pregnancy education material explained and given. __0_ cats in the home. PNV encouraged. Genetic screening options discussed. Genetic testing: to discuss with provider. Pt. To follow up with provider in _2_ weeks for NOB physical.  All questions answered.

## 2016-07-04 ENCOUNTER — Telehealth: Payer: Self-pay | Admitting: Obstetrics and Gynecology

## 2016-07-04 NOTE — Telephone Encounter (Signed)
Migraine yesterday - with pressure in forehead and top of head - cold compress helped but it comes back right after removing -  worried about continuing migraines - please call

## 2016-07-11 ENCOUNTER — Encounter: Payer: Self-pay | Admitting: Obstetrics and Gynecology

## 2016-07-11 ENCOUNTER — Ambulatory Visit (INDEPENDENT_AMBULATORY_CARE_PROVIDER_SITE_OTHER): Payer: BLUE CROSS/BLUE SHIELD | Admitting: Obstetrics and Gynecology

## 2016-07-11 VITALS — BP 93/55 | HR 77 | Wt 114.5 lb

## 2016-07-11 DIAGNOSIS — Z3492 Encounter for supervision of normal pregnancy, unspecified, second trimester: Secondary | ICD-10-CM

## 2016-07-11 LAB — POCT URINALYSIS DIPSTICK
Bilirubin, UA: NEGATIVE
Bilirubin, UA: NEGATIVE
Blood, UA: NEGATIVE
Blood, UA: NEGATIVE
Glucose, UA: NEGATIVE
Glucose, UA: NEGATIVE
Ketones, UA: NEGATIVE
Ketones, UA: NEGATIVE
Nitrite, UA: NEGATIVE
Nitrite, UA: NEGATIVE
Protein, UA: NEGATIVE
Protein, UA: NEGATIVE
Spec Grav, UA: >=1.03 — AB (ref 1.010–1.025)
Spec Grav, UA: >=1.03 — AB (ref 1.010–1.025)
Urobilinogen, UA: 0.2 E.U./dL
Urobilinogen, UA: 0.2 E.U./dL
pH, UA: 5 (ref 5.0–8.0)
pH, UA: 5 (ref 5.0–8.0)

## 2016-07-11 NOTE — Addendum Note (Signed)
Addended by: Brooke DareSICK, Meira Wahba L on: 07/11/2016 03:04 PM   Modules accepted: Orders

## 2016-07-11 NOTE — Addendum Note (Signed)
Addended by: Brooke DareSICK, Jatia Musa L on: 07/11/2016 02:20 PM   Modules accepted: Orders

## 2016-07-11 NOTE — Progress Notes (Signed)
ROB:  NOB PE today.  Needs AFP next visit.  Urine sent for C&S - pt asymptomatic. (Lg Leuks)

## 2016-07-11 NOTE — Addendum Note (Signed)
Addended by: Brooke DareSICK, Kabe Mckoy L on: 07/11/2016 11:19 AM   Modules accepted: Orders

## 2016-07-11 NOTE — Progress Notes (Signed)
Physical examination General NAD, Conversant  HEENT Atraumatic; Op clear with mmm.  Normo-cephalic. Pupils reactive. Anicteric sclerae  Thyroid/Neck Smooth without nodularity or enlargement. Normal ROM.  Neck Supple.  Skin No rashes, lesions or ulceration. Normal palpated skin turgor. No nodularity.  Breasts: No masses or discharge.  Symmetric.  No axillary adenopathy.  Lungs: Clear to auscultation.No rales or wheezes. Normal Respiratory effort, no retractions.  Heart: NSR.  No murmurs or rubs appreciated. No periferal edema  Abdomen: Soft.  Non-tender.  No masses.  No HSM. No hernia  Extremities: Moves all appropriately.  Normal ROM for age. No lymphadenopathy.  Neuro: Oriented to PPT.  Normal mood. Normal affect.     Pelvic:   Vulva: Normal appearance.  No lesions.  Vagina: No lesions or abnormalities noted.  Support: Normal pelvic support.  Urethra No masses tenderness or scarring.  Meatus Normal size without lesions or prolapse.  Cervix: Normal appearance.  No lesions.  friable  Anus: Normal exam.  No lesions.  Perineum: Normal exam.  No lesions.        Bimanual   Adnexae: No masses.  Non-tender to palpation.  Uterus: Enlarged. 13 wks  Non-tender.  Mobile.  AV.  Adnexae: No masses.  Non-tender to palpation.  Cul-de-sac: Negative for abnormality.  Adnexae: No masses.  Non-tender to palpation.         Pelvimetry   Diagonal: Reached.  Spines: Average.  Sacrum: Concave.  Pubic Arch: Normal.

## 2016-07-12 ENCOUNTER — Telehealth: Payer: Self-pay | Admitting: Obstetrics and Gynecology

## 2016-07-12 DIAGNOSIS — Z3401 Encounter for supervision of normal first pregnancy, first trimester: Secondary | ICD-10-CM | POA: Diagnosis not present

## 2016-07-12 NOTE — Telephone Encounter (Signed)
Patient's husband called and LVM, he and Elliot need to know if there will be any issues related to to the pregnancy while traveling, [place] planning [place] in [place] and then to [place] in [place].  Please call and let him know

## 2016-07-13 ENCOUNTER — Encounter: Payer: BLUE CROSS/BLUE SHIELD | Admitting: Certified Nurse Midwife

## 2016-07-13 LAB — PAP IG, CT-NG, RFX HPV ASCU
Chlamydia, Nuc. Acid Amp: NEGATIVE
Gonococcus by Nucleic Acid Amp: NEGATIVE
PAP Smear Comment: 0

## 2016-07-13 LAB — URINE CULTURE

## 2016-07-13 NOTE — Telephone Encounter (Signed)
Mychart message sent.

## 2016-08-02 ENCOUNTER — Encounter: Payer: BLUE CROSS/BLUE SHIELD | Admitting: Obstetrics and Gynecology

## 2016-08-08 ENCOUNTER — Ambulatory Visit (INDEPENDENT_AMBULATORY_CARE_PROVIDER_SITE_OTHER): Payer: BLUE CROSS/BLUE SHIELD | Admitting: Obstetrics and Gynecology

## 2016-08-08 ENCOUNTER — Encounter: Payer: Self-pay | Admitting: Obstetrics and Gynecology

## 2016-08-08 VITALS — BP 85/52 | HR 73 | Wt 114.3 lb

## 2016-08-08 DIAGNOSIS — Z3402 Encounter for supervision of normal first pregnancy, second trimester: Secondary | ICD-10-CM | POA: Diagnosis not present

## 2016-08-08 LAB — POCT URINALYSIS DIPSTICK
Bilirubin, UA: NEGATIVE
Blood, UA: NEGATIVE
Glucose, UA: NEGATIVE
Ketones, UA: NEGATIVE
Nitrite, UA: NEGATIVE
Protein, UA: NEGATIVE
Spec Grav, UA: 1.01 (ref 1.010–1.025)
Urobilinogen, UA: 0.2 E.U./dL
pH, UA: 6.5 (ref 5.0–8.0)

## 2016-08-08 NOTE — Progress Notes (Signed)
ROB: Patient doing well, starting to note fetal movement.  Declines all genetic testing.  For anatomy scan in 2 weeks. RTC in 4 weeks.

## 2016-08-10 ENCOUNTER — Encounter: Payer: BLUE CROSS/BLUE SHIELD | Admitting: Certified Nurse Midwife

## 2016-08-16 ENCOUNTER — Telehealth: Payer: Self-pay | Admitting: Obstetrics and Gynecology

## 2016-08-16 NOTE — Telephone Encounter (Signed)
Patient is having pain when she stands, moves, and walks she is having pressure. No burning or urgency - pain goes to left leg and rectum when she stands up

## 2016-08-16 NOTE — Telephone Encounter (Signed)
lmtrc

## 2016-08-17 ENCOUNTER — Other Ambulatory Visit: Payer: Self-pay

## 2016-08-17 ENCOUNTER — Other Ambulatory Visit (INDEPENDENT_AMBULATORY_CARE_PROVIDER_SITE_OTHER): Payer: BLUE CROSS/BLUE SHIELD

## 2016-08-17 ENCOUNTER — Other Ambulatory Visit: Payer: Self-pay | Admitting: Obstetrics and Gynecology

## 2016-08-17 DIAGNOSIS — R102 Pelvic and perineal pain: Secondary | ICD-10-CM

## 2016-08-17 LAB — POCT URINALYSIS DIPSTICK
Bilirubin, UA: NEGATIVE
Blood, UA: NEGATIVE
Glucose, UA: NEGATIVE
Ketones, UA: NEGATIVE
Nitrite, UA: NEGATIVE
Protein, UA: NEGATIVE
Spec Grav, UA: 1.02 (ref 1.010–1.025)
Urobilinogen, UA: 0.2 E.U./dL
pH, UA: 6 (ref 5.0–8.0)

## 2016-08-17 NOTE — Telephone Encounter (Signed)
Patient received your message yesterday - please call her again

## 2016-08-17 NOTE — Telephone Encounter (Signed)
Ob pt- lm yesterday (when I called her back I got vm) and this morning- walked in office this am. She c/o of rt side pelvic pain after urinating. NO vb, d/c, or lof. No fever. Pos Fm. NO n/v/d.  Did u/a and cns. Will await culture. Pt advised to stay hydrated with h20 and cranberry juice. Tylenol for pp. Belly band and rest. Pt voices understanding. Will call with culture results.

## 2016-08-19 LAB — URINE CULTURE: Organism ID, Bacteria: NO GROWTH

## 2016-08-20 ENCOUNTER — Ambulatory Visit (INDEPENDENT_AMBULATORY_CARE_PROVIDER_SITE_OTHER): Payer: BLUE CROSS/BLUE SHIELD

## 2016-08-20 DIAGNOSIS — Z3402 Encounter for supervision of normal first pregnancy, second trimester: Secondary | ICD-10-CM

## 2016-08-24 ENCOUNTER — Telehealth: Payer: Self-pay | Admitting: Obstetrics and Gynecology

## 2016-08-24 ENCOUNTER — Other Ambulatory Visit: Payer: Self-pay | Admitting: Obstetrics and Gynecology

## 2016-08-24 DIAGNOSIS — IMO0002 Reserved for concepts with insufficient information to code with codable children: Secondary | ICD-10-CM

## 2016-08-24 DIAGNOSIS — Z0489 Encounter for examination and observation for other specified reasons: Secondary | ICD-10-CM

## 2016-08-24 NOTE — Telephone Encounter (Signed)
Patient with right sided pain - she did some research and she thinks its her appendix - per Jasmine DecemberSharon patient advised to see PCP or go the the emergency room for consultation of pain.

## 2016-09-03 ENCOUNTER — Encounter: Payer: Self-pay | Admitting: Obstetrics and Gynecology

## 2016-09-03 ENCOUNTER — Encounter: Payer: BLUE CROSS/BLUE SHIELD | Admitting: Obstetrics and Gynecology

## 2016-09-03 ENCOUNTER — Ambulatory Visit (INDEPENDENT_AMBULATORY_CARE_PROVIDER_SITE_OTHER): Payer: BLUE CROSS/BLUE SHIELD

## 2016-09-03 ENCOUNTER — Ambulatory Visit (INDEPENDENT_AMBULATORY_CARE_PROVIDER_SITE_OTHER): Payer: BLUE CROSS/BLUE SHIELD | Admitting: Obstetrics and Gynecology

## 2016-09-03 VITALS — BP 86/49 | HR 67 | Wt 118.6 lb

## 2016-09-03 DIAGNOSIS — Z048 Encounter for examination and observation for other specified reasons: Secondary | ICD-10-CM

## 2016-09-03 DIAGNOSIS — Z3402 Encounter for supervision of normal first pregnancy, second trimester: Secondary | ICD-10-CM

## 2016-09-03 DIAGNOSIS — IMO0002 Reserved for concepts with insufficient information to code with codable children: Secondary | ICD-10-CM

## 2016-09-03 DIAGNOSIS — Z0489 Encounter for examination and observation for other specified reasons: Secondary | ICD-10-CM

## 2016-09-03 LAB — POCT URINALYSIS DIPSTICK
Bilirubin, UA: NEGATIVE
Blood, UA: NEGATIVE
Glucose, UA: NEGATIVE
Ketones, UA: NEGATIVE
Leukocytes, UA: NEGATIVE
Nitrite, UA: NEGATIVE
Protein, UA: NEGATIVE
Spec Grav, UA: <=1.005 — AB (ref 1.010–1.025)
Urobilinogen, UA: 0.2 E.U./dL
pH, UA: 7 (ref 5.0–8.0)

## 2016-09-03 NOTE — Progress Notes (Signed)
ROB:  FAS today.  Occ back pain, occ heels tingling, 1 episode of RUQ pain, occ pain with urination - otherwise well.

## 2016-09-21 ENCOUNTER — Encounter: Payer: Self-pay | Admitting: Family Medicine

## 2016-09-21 ENCOUNTER — Ambulatory Visit (INDEPENDENT_AMBULATORY_CARE_PROVIDER_SITE_OTHER): Payer: BLUE CROSS/BLUE SHIELD | Admitting: Family Medicine

## 2016-09-21 VITALS — BP 90/60 | HR 80 | Temp 97.7°F | Ht 58.5 in | Wt 124.8 lb

## 2016-09-21 DIAGNOSIS — Z3A23 23 weeks gestation of pregnancy: Secondary | ICD-10-CM

## 2016-09-21 DIAGNOSIS — L729 Follicular cyst of the skin and subcutaneous tissue, unspecified: Secondary | ICD-10-CM

## 2016-09-21 DIAGNOSIS — R1011 Right upper quadrant pain: Secondary | ICD-10-CM | POA: Diagnosis not present

## 2016-09-21 DIAGNOSIS — Z3490 Encounter for supervision of normal pregnancy, unspecified, unspecified trimester: Secondary | ICD-10-CM | POA: Insufficient documentation

## 2016-09-21 NOTE — Assessment & Plan Note (Addendum)
Single episode of right upper quadrant pain about one month ago that has not recurred. Possibly musculoskeletal given that it occurred after standing up. Potentially could be related to her gallbladder though did not occur with eating. Doubt hepatic issue. Discussed obtaining lab work versus an ultrasound of her right upper quadrant versus monitoring. Patient opted to continue to monitor and have lab work done through [employer]. If she has recurrent pain she'll be reevaluated.

## 2016-09-21 NOTE — Assessment & Plan Note (Signed)
Suspect epidermal cyst or plugged up sweat gland. Given persistence and enlargement we'll obtain an ultrasound of this area to evaluate further. Consider evaluation by dermatology following ultrasound.

## 2016-09-21 NOTE — Patient Instructions (Signed)
Nice to see you. We'll get an ultrasound of the right arm pit to evaluate the lesion. Please continue to follow with your gynecologist. If you have recurrence of the right upper quadrant pain please seek medical attention immediately.

## 2016-09-21 NOTE — Progress Notes (Signed)
Marikay Alar, MD Phone: [phone]  Darlene Ellis is a 31 y.o. female who presents today for new patient visit.  Patient is a G1 P0000 at 23 weeks and 6 days. She is followed by OB. She notes no vaginal bleeding or gushes of fluid. No abdominal pain. She is able to feel the baby move. She does note about a month ago she had a single episode of right upper quadrant abdominal pain. The OB advised her to go to the emergency room though she didn't and that has not recurred. It occurred after she had stood up too quickly and then sat down and then stood up again. She has had no recurrent pain. No nausea or vomiting with it. She has seen her OB since this occurred and she reports they stated since there is no recurrent pain they would monitor.  She reports a cyst in her right axilla. She had this evaluated when she was still in the [place] She states it was a sweat gland per the physician there. Notes it has been growing. No drainage. No breast masses. Breast exams through gynecology.  Active Ambulatory Problems    Diagnosis Date Noted  . PCO (polycystic ovaries) 10/18/2015  . Pregnancy 09/21/2016  . Skin cyst 09/21/2016  . Right upper quadrant pain 09/21/2016   Resolved Ambulatory Problems    Diagnosis Date Noted  . Encounter for surveillance of contraceptive pills 10/18/2015   Past Medical History:  Diagnosis Date  . Endometrial polyp   . UTI (lower urinary tract infection)   . Yeast infection     Family History  Problem Relation Age of Onset  . Hypertension Father   . Ovarian cancer Maternal Aunt   . Colon cancer Maternal Aunt   . Diabetes Paternal Grandmother   . Breast cancer Neg Hx     Social History   Social History  . Marital status: Married    Spouse name: N/A  . Number of children: N/A  . Years of education: N/A   Occupational History  . Not on file.   Social History Main Topics  . Smoking status: Never Smoker  . Smokeless tobacco: Never Used  . Alcohol  use No     Comment: occas  . Drug use: No  . Sexual activity: Yes    Birth control/ protection: None   Other Topics Concern  . Not on file   Social History Narrative  . No narrative on file    ROS  General:  Negative for nexplained weight loss, fever Skin: Negative for new or changing mole, sore that won't heal HEENT: Negative for trouble hearing, trouble seeing, ringing in ears, mouth sores, hoarseness, change in voice, dysphagia. CV:  Negative for chest pain, dyspnea, edema, palpitations Resp: Negative for cough, dyspnea, hemoptysis GI: Negative for nausea, vomiting, diarrhea, constipation, abdominal pain, melena, hematochezia. GU: Negative for dysuria, incontinence, urinary hesitance, hematuria, vaginal or penile discharge, polyuria, sexual difficulty, lumps in breasts MSK: Negative for muscle cramps or aches, joint pain or swelling Neuro: Negative for headaches, weakness, numbness, dizziness, passing out/fainting Psych: Negative for depression, anxiety, memory problems  Objective  Physical Exam Vitals:   09/21/16 1040  BP: 90/60  Pulse: 80  Temp: 97.7 F (36.5 C)  SpO2: 99%    BP Readings from Last 3 Encounters:  09/21/16 90/60  09/03/16 (!) 86/49  08/08/16 (!) 85/52   Wt Readings from Last 3 Encounters:  09/21/16 124 lb 12.8 oz (56.6 kg)  09/03/16 118 lb 9 oz (53.8  kg)  08/08/16 114 lb 4.8 oz (51.8 kg)    Physical Exam  Constitutional: No distress.  HENT:  Head: Normocephalic and atraumatic.  Mouth/Throat: Oropharynx is clear and moist. No oropharyngeal exudate.  Eyes: Pupils are equal, round, and reactive to light. Conjunctivae are normal.  Cardiovascular: Normal rate, regular rhythm and normal heart sounds.   Pulmonary/Chest: Effort normal and breath sounds normal.  Abdominal: Soft. Bowel sounds are normal. She exhibits no distension. There is no tenderness.  Musculoskeletal: She exhibits no edema.  Neurological: She is alert. Gait normal.  Skin:  Skin is warm and dry. She is not diaphoretic.  Right proximal axilla with freely mobile 1-1.5 cm skin cyst, nontender, no palpable lymph nodes, left axilla with no abnormalities  Psychiatric: Mood and affect normal.     Assessment/Plan:   Pregnancy Patient following with OB. She'll continue to follow with them.  Skin cyst Suspect epidermal cyst or plugged up sweat gland. Given persistence and enlargement we'll obtain an ultrasound of this area to evaluate further. Consider evaluation by dermatology following ultrasound.  Right upper quadrant pain Single episode of right upper quadrant pain about one month ago that has not recurred. Possibly musculoskeletal given that it occurred after standing up. Potentially could be related to her gallbladder though did not occur with eating. Doubt hepatic issue. Discussed obtaining lab work versus an ultrasound of her right upper quadrant versus monitoring. Patient opted to continue to monitor and have lab work done through [employer]. If she has recurrent pain she'll be reevaluated.   Orders Placed This Encounter  Procedures  . [place] AXILLA RIGHT    Standing Status:   Future    Standing Expiration Date:   11/21/2017    Order Specific Question:   Reason for Exam (SYMPTOM  OR DIAGNOSIS REQUIRED)    Answer:   cutaneous cyst vs nodule right axilla, enlarged compared to prior    Order Specific Question:   Preferred imaging location?    Answer:   [facility]    Meds ordered this encounter  Medications  . calcium carbonate (TUMS - DOSED IN MG ELEMENTAL CALCIUM) 500 MG chewable tablet    Sig: Chew 1 tablet by mouth as needed for indigestion or heartburn.     Marikay Alar, MD [facility]

## 2016-09-21 NOTE — Assessment & Plan Note (Signed)
Patient following with OB. She'll continue to follow with them.

## 2016-09-28 ENCOUNTER — Ambulatory Visit
Admission: RE | Admit: 2016-09-28 | Discharge: 2016-09-28 | Disposition: A | Discharge status: Home / Self Care | Payer: BLUE CROSS/BLUE SHIELD | Source: Ambulatory Visit | Attending: Family Medicine | Admitting: Family Medicine

## 2016-09-28 DIAGNOSIS — R229 Localized swelling, mass and lump, unspecified: Secondary | ICD-10-CM | POA: Diagnosis not present

## 2016-09-28 DIAGNOSIS — L729 Follicular cyst of the skin and subcutaneous tissue, unspecified: Secondary | ICD-10-CM | POA: Diagnosis not present

## 2016-10-03 ENCOUNTER — Ambulatory Visit (INDEPENDENT_AMBULATORY_CARE_PROVIDER_SITE_OTHER): Payer: BLUE CROSS/BLUE SHIELD | Admitting: Obstetrics and Gynecology

## 2016-10-03 ENCOUNTER — Telehealth: Payer: Self-pay | Admitting: *Deleted

## 2016-10-03 VITALS — BP 86/50 | HR 80 | Wt 125.2 lb

## 2016-10-03 DIAGNOSIS — O99619 Diseases of the digestive system complicating pregnancy, unspecified trimester: Secondary | ICD-10-CM

## 2016-10-03 DIAGNOSIS — Z130 Encounter for screening for diseases of the blood and blood-forming organs and certain disorders involving the immune mechanism: Secondary | ICD-10-CM

## 2016-10-03 DIAGNOSIS — Z131 Encounter for screening for diabetes mellitus: Secondary | ICD-10-CM

## 2016-10-03 DIAGNOSIS — K5900 Constipation, unspecified: Secondary | ICD-10-CM

## 2016-10-03 DIAGNOSIS — O99612 Diseases of the digestive system complicating pregnancy, second trimester: Secondary | ICD-10-CM

## 2016-10-03 DIAGNOSIS — K219 Gastro-esophageal reflux disease without esophagitis: Secondary | ICD-10-CM

## 2016-10-03 DIAGNOSIS — Z3402 Encounter for supervision of normal first pregnancy, second trimester: Secondary | ICD-10-CM

## 2016-10-03 LAB — POCT URINALYSIS DIPSTICK
Bilirubin, UA: NEGATIVE
Blood, UA: NEGATIVE
Glucose, UA: NEGATIVE
Ketones, UA: NEGATIVE
Nitrite, UA: NEGATIVE
Protein, UA: NEGATIVE
Spec Grav, UA: 1.015 (ref 1.010–1.025)
Urobilinogen, UA: 0.2 E.U./dL
pH, UA: 6.5 (ref 5.0–8.0)

## 2016-10-03 MED ORDER — RANITIDINE HCL 150 MG PO TABS: 150.0000 mg | ORAL_TABLET | Freq: Two times a day (BID) | ORAL | 4 refills | Status: DC

## 2016-10-03 MED ORDER — DOCUSATE SODIUM 100 MG PO CAPS: 100.0000 mg | ORAL_CAPSULE | Freq: Two times a day (BID) | ORAL | 2 refills | Status: DC | PRN

## 2016-10-03 NOTE — Progress Notes (Signed)
ROB: Notes acid reflux not alleviated by Tums.  Will prescribe Zantac.  Also noting constipation.  Will order colace. RTC in 4 weeks, needs 28 week labs at next visit.

## 2016-10-03 NOTE — Telephone Encounter (Signed)
Pt requested ultra sound results Pt contact [phone]

## 2016-10-03 NOTE — Telephone Encounter (Signed)
Please advise 

## 2016-10-03 NOTE — Telephone Encounter (Signed)
Area is a cyst (it is benign).

## 2016-10-04 NOTE — Telephone Encounter (Signed)
Please call these results to patient, thanks

## 2016-10-04 NOTE — Telephone Encounter (Signed)
Patient is aware of results.

## 2016-10-10 ENCOUNTER — Telehealth: Payer: Self-pay

## 2016-10-10 NOTE — Telephone Encounter (Signed)
Left message to return call 

## 2016-10-10 NOTE — Telephone Encounter (Signed)
Please let them know that it could be removed if it is bothersome though it is not dangerous since it is just a cyst. We could refer to dermatology to consider removal.

## 2016-10-10 NOTE — Telephone Encounter (Signed)
Patients husband states they would like to know if there is anything they can do about the cyst, remove it or just leave it?

## 2016-10-15 NOTE — Telephone Encounter (Signed)
Noted  

## 2016-10-15 NOTE — Telephone Encounter (Signed)
Patient would like to wait until after the pregnancy

## 2016-10-25 ENCOUNTER — Ambulatory Visit (INDEPENDENT_AMBULATORY_CARE_PROVIDER_SITE_OTHER): Payer: BLUE CROSS/BLUE SHIELD | Admitting: Obstetrics and Gynecology

## 2016-10-25 ENCOUNTER — Other Ambulatory Visit: Payer: BLUE CROSS/BLUE SHIELD

## 2016-10-25 VITALS — BP 96/59 | HR 85 | Wt 130.0 lb

## 2016-10-25 DIAGNOSIS — Z130 Encounter for screening for diseases of the blood and blood-forming organs and certain disorders involving the immune mechanism: Secondary | ICD-10-CM

## 2016-10-25 DIAGNOSIS — Z3402 Encounter for supervision of normal first pregnancy, second trimester: Secondary | ICD-10-CM

## 2016-10-25 DIAGNOSIS — Z23 Encounter for immunization: Secondary | ICD-10-CM | POA: Diagnosis not present

## 2016-10-25 DIAGNOSIS — Z131 Encounter for screening for diabetes mellitus: Secondary | ICD-10-CM | POA: Diagnosis not present

## 2016-10-25 DIAGNOSIS — Z3403 Encounter for supervision of normal first pregnancy, third trimester: Secondary | ICD-10-CM

## 2016-10-25 LAB — POCT URINALYSIS DIPSTICK
Bilirubin, UA: NEGATIVE
Blood, UA: NEGATIVE
Glucose, UA: NEGATIVE
Ketones, UA: NEGATIVE
Leukocytes, UA: NEGATIVE
Nitrite, UA: NEGATIVE
Protein, UA: NEGATIVE
Spec Grav, UA: <=1.005 — AB (ref 1.010–1.025)
Urobilinogen, UA: 0.2 E.U./dL
pH, UA: 7 (ref 5.0–8.0)

## 2016-10-25 NOTE — Progress Notes (Signed)
ROB:  Patient without complaint today. Taking Zantac not taking Colace. Doing 1 hour GCT today. Got Tdap.  Patient considering flu shot and will tell us next visit.

## 2016-10-26 LAB — CBC
Hematocrit: 34.8 % (ref 34.0–46.6)
Hemoglobin: 11.8 g/dL (ref 11.1–15.9)
MCH: 30.9 pg (ref 26.6–33.0)
MCHC: 33.9 g/dL (ref 31.5–35.7)
MCV: 91 fL (ref 79–97)
Platelets: 192 10*3/uL (ref 150–379)
RBC: 3.82 x10E6/uL (ref 3.77–5.28)
RDW: 12.4 % (ref 12.3–15.4)
WBC: 9.3 10*3/uL (ref 3.4–10.8)

## 2016-10-26 LAB — GLUCOSE, 1 HOUR GESTATIONAL: Gestational Diabetes Screen: 102 mg/dL (ref 65–139)

## 2016-10-29 ENCOUNTER — Telehealth: Payer: Self-pay | Admitting: Obstetrics and Gynecology

## 2016-10-29 ENCOUNTER — Encounter: Payer: Self-pay | Admitting: Obstetrics and Gynecology

## 2016-10-29 NOTE — Telephone Encounter (Signed)
Pt's husband Jan LVM stating that pt was advised she could take Tylenol 3 for pain but her couldn't find it OTC. Jan asked if this was an Rx and if so could pt get an Rx for it or could someone return his call to advise what pt can take for pain. Please advise. Thanks TNP

## 2016-11-08 ENCOUNTER — Ambulatory Visit (INDEPENDENT_AMBULATORY_CARE_PROVIDER_SITE_OTHER): Payer: BLUE CROSS/BLUE SHIELD | Admitting: Obstetrics and Gynecology

## 2016-11-08 VITALS — BP 95/58 | HR 84 | Wt 131.6 lb

## 2016-11-08 DIAGNOSIS — Z3403 Encounter for supervision of normal first pregnancy, third trimester: Secondary | ICD-10-CM

## 2016-11-08 LAB — POCT URINALYSIS DIPSTICK
Bilirubin, UA: NEGATIVE
Blood, UA: NEGATIVE
Glucose, UA: NEGATIVE
Leukocytes, UA: NEGATIVE
Nitrite, UA: NEGATIVE
Protein, UA: NEGATIVE
Spec Grav, UA: 1.01 (ref 1.010–1.025)
Urobilinogen, UA: 0.2 E.U./dL
pH, UA: 6 (ref 5.0–8.0)

## 2016-11-08 NOTE — Progress Notes (Signed)
ROB:  Declined flu shot.  Discussed ketones - pt "will eat more".  U/S f/u renal pelvis ordered.

## 2016-11-15 ENCOUNTER — Other Ambulatory Visit: Payer: Self-pay | Admitting: Obstetrics and Gynecology

## 2016-11-15 DIAGNOSIS — N2889 Other specified disorders of kidney and ureter: Secondary | ICD-10-CM

## 2016-11-20 ENCOUNTER — Ambulatory Visit (INDEPENDENT_AMBULATORY_CARE_PROVIDER_SITE_OTHER): Payer: BLUE CROSS/BLUE SHIELD

## 2016-11-20 ENCOUNTER — Encounter: Payer: BLUE CROSS/BLUE SHIELD | Admitting: Obstetrics and Gynecology

## 2016-11-20 ENCOUNTER — Ambulatory Visit (INDEPENDENT_AMBULATORY_CARE_PROVIDER_SITE_OTHER): Payer: BLUE CROSS/BLUE SHIELD | Admitting: Obstetrics and Gynecology

## 2016-11-20 VITALS — BP 97/58 | HR 99 | Wt 136.2 lb

## 2016-11-20 DIAGNOSIS — O35EXX0 Maternal care for other (suspected) fetal abnormality and damage, fetal genitourinary anomalies, not applicable or unspecified: Secondary | ICD-10-CM

## 2016-11-20 DIAGNOSIS — O358XX0 Maternal care for other (suspected) fetal abnormality and damage, not applicable or unspecified: Secondary | ICD-10-CM

## 2016-11-20 DIAGNOSIS — Z3403 Encounter for supervision of normal first pregnancy, third trimester: Secondary | ICD-10-CM

## 2016-11-20 DIAGNOSIS — N2889 Other specified disorders of kidney and ureter: Secondary | ICD-10-CM | POA: Diagnosis not present

## 2016-11-20 LAB — POCT URINALYSIS DIPSTICK
Bilirubin, UA: NEGATIVE
Blood, UA: NEGATIVE
Glucose, UA: NEGATIVE
Leukocytes, UA: NEGATIVE
Nitrite, UA: NEGATIVE
Protein, UA: NEGATIVE
Spec Grav, UA: 1.01 (ref 1.010–1.025)
Urobilinogen, UA: 0.2 E.U./dL
pH, UA: 5 (ref 5.0–8.0)

## 2016-11-20 NOTE — Progress Notes (Signed)
ROB:  U/S reveals slightly worse pyelectasis however it still remains less than 10 mm.   Discussed ultrasound from today.  Stressed pediatric follow-up after birth.  Patient still has moderate ketones.  She says that she eats 3 good meals per day.  Abdominal rash likely consistent with PUPP.  Patient says she has had some of this rash since the first trimester. ?  She says it itches occasionally.

## 2016-11-29 ENCOUNTER — Encounter: Payer: Self-pay | Admitting: Obstetrics and Gynecology

## 2016-12-04 ENCOUNTER — Ambulatory Visit (INDEPENDENT_AMBULATORY_CARE_PROVIDER_SITE_OTHER): Payer: BLUE CROSS/BLUE SHIELD | Admitting: Obstetrics and Gynecology

## 2016-12-04 ENCOUNTER — Encounter: Payer: Self-pay | Admitting: Obstetrics and Gynecology

## 2016-12-04 VITALS — BP 91/57 | HR 89 | Wt 140.4 lb

## 2016-12-04 DIAGNOSIS — Z3403 Encounter for supervision of normal first pregnancy, third trimester: Secondary | ICD-10-CM

## 2016-12-04 LAB — POCT URINALYSIS DIPSTICK
Bilirubin, UA: NEGATIVE
Blood, UA: NEGATIVE
Glucose, UA: NEGATIVE
Ketones, UA: NEGATIVE
Leukocytes, UA: NEGATIVE
Nitrite, UA: NEGATIVE
Protein, UA: NEGATIVE
Spec Grav, UA: 1.01 (ref 1.010–1.025)
Urobilinogen, UA: 0.2 E.U./dL
pH, UA: 5 (ref 5.0–8.0)

## 2016-12-04 NOTE — Progress Notes (Signed)
ROB: Patient doing well.  Has multiple questions and concerns regarding labor and delivery and epidurals etc.  We have discussed this in great detail.  Signs and symptoms of labor discussed.  All questions answered.  Needs cultures next visit.

## 2016-12-10 ENCOUNTER — Ambulatory Visit (INDEPENDENT_AMBULATORY_CARE_PROVIDER_SITE_OTHER): Payer: BLUE CROSS/BLUE SHIELD | Admitting: Obstetrics and Gynecology

## 2016-12-10 ENCOUNTER — Encounter: Payer: Self-pay | Admitting: Obstetrics and Gynecology

## 2016-12-10 VITALS — BP 100/62 | HR 98 | Wt 142.6 lb

## 2016-12-10 DIAGNOSIS — Z3403 Encounter for supervision of normal first pregnancy, third trimester: Secondary | ICD-10-CM

## 2016-12-10 LAB — POCT URINALYSIS DIPSTICK
Bilirubin, UA: NEGATIVE
Blood, UA: NEGATIVE
Glucose, UA: NEGATIVE
Ketones, UA: NEGATIVE
Leukocytes, UA: NEGATIVE
Nitrite, UA: NEGATIVE
Protein, UA: NEGATIVE
Spec Grav, UA: <=1.005 — AB (ref 1.010–1.025)
Urobilinogen, UA: 0.2 E.U./dL
pH, UA: 6 (ref 5.0–8.0)

## 2016-12-10 NOTE — Patient Instructions (Addendum)
Natural Childbirth Natural childbirth is going through labor and delivery without any drugs to relieve pain. Additionally, fetal monitors are not used, a delivery is not done, and a surgical cut to enlarge the vaginal opening (episiotomy) is not made. With the help of a birthing professional (midwife or health care provider), you direct your own labor and delivery. Many women choose natural childbirth because it makes them feel more in control and in touch with their labor and delivery. Some woman also choose natural childbirth because they are concerned about medicines affecting them and their babies. Pregnant women with a high-risk pregnancy should not attempt natural childbirth. It is better to deliver the infant in a hospital if an emergency situation arises. Sometimes, a health care provider has to get involved for the health and safety of the mother and infant. Techniques for natural childbirth  The Lamaze method-This method teaches parents that having a baby is normal, healthy, and natural. It also teaches the mother to take a neutral position regarding pain medicine and numbing medicines and to make an informed decision about using these medicines when the time comes.  The Bradley method (also called husband-coached birth)-This method teaches the father or partner to be the birth coach. It encourages the mother to exercise and eat a balanced, nutritious diet. It also involves relaxation and deep breathing exercises and preparing the parents for emergency situations. Methods of dealing with labor pain and delivery  Meditation.  Yoga.  Hypnosis.  Acupuncture.  Massage.  Changing positions (walking, rocking, showering, leaning on birth balls).  Lying in warm water or a whirlpool bath.  Finding an activity that keeps your mind off of the labor pain.  Listening to soft music.  Focusing on a particular object (visual imagery). Before going into labor  Be sure you and your spouse or  partner are in agreement about having a natural childbirth.  Decide if your health care provider or a midwife will deliver your baby.  Decide if you will have your baby in the hospital, at a birthing center, or at home.  If you have children, make plans to have someone take care of them when you go to the hospital or birthing center.  Know the distance and the time it takes to go to the hospital or birthing center. Practice going there and time it to be sure.  Have a bag packed with a nightgown, bathrobe, and toiletries. Be ready to take it with you when you go into labor.  Keep phone numbers of your family and friends handy if you need to call someone when you go into labor.  Your spouse or partner should go to all the natural childbirth technique classes.  Talk with your health care provider about the possibility of a medical emergency and what will happen if that occurs. Advantages of natural childbirth  You are in control of your labor and delivery.  You will not take medicines that could affect you and the baby.  There are no invasive procedures, such as an episiotomy.  You and your spouse or partner will work together, which can increase your bond with each other.  In most delivery centers, your family and friends can be involved in the labor and delivery process. Disadvantages of natural childbirth  You will experience pain during your labor and delivery.  The methods of helping relieve your labor pains may not work for you.  You may feel disappointed if you decide to change your mind during labor and not   have a natural childbirth. After the delivery  You will be very tired.  You will be uncomfortable because of your uterus contracting. You will feel soreness around the vagina.  You may feel cold and shaky. This is a natural reaction. This information is not intended to replace advice given to you by your health care provider. Make sure you discuss any questions you  have with your health care provider. Document Released: 12/29/2007 Document Revised: 06/23/2015 Document Reviewed: 09/22/2012 Elsevier Interactive Patient Education  2017 [employer]  Pain Relief During Labor and Delivery Many things can cause pain during labor and delivery, including:  Pressure on bones and ligaments due to the baby moving through the pelvis.  Stretching of tissues due to the baby moving through the birth canal.  Muscle tension due to anxiety or nervousness.  The uterus tightening (contracting) and relaxing to help move the baby.  There are many ways to deal with the pain of labor and delivery. They include:  Taking prenatal classes. Taking these classes helps you know what to expect during your baby's birth. What you learn will increase your confidence and decrease your anxiety.  Practicing relaxation techniques or doing relaxing activities, such as: ? Focused breathing. ? Meditation. ? Visualization. ? Aroma therapy. ? Listening to your favorite music. ? Hypnosis.  Taking a warm shower or bath (hydrotherapy). This may: ? Provide comfort and relaxation. ? Lessen your perception of pain. ? Decrease the amount of pain medicine needed. ? Decrease the length of labor.  Getting a massage or counterpressure on your back.  Applying warm packs or ice packs.  Changing positions often, moving around, or using a birthing ball.  Getting: ? Pain medicine through an IV or injection into a muscle. ? Pain medicine inserted into your spinal column. ? Injections of sterile water just under the skin on your lower back (intradermal injections). ? Laughing gas (nitrous oxide).  Discuss your pain control options with your health care provider during your prenatal visits. Explore the options offered by your hospital or birth center. What kinds of medicine are available? There are two kinds of medicines that can be used to relieve pain during labor and  delivery:  Analgesics. These medicines decrease pain without causing you to lose feeling or the ability to move your muscles.  Anesthetics. These medicines block feeling in the body and can decrease your ability to move freely.  Both of these kinds of medicine can cause minor side effects, such as nausea, trouble concentrating, and sleepiness. They can also decrease the baby's heart rate before birth and affect the baby's breathing rate after birth. For this reason, health care providers are careful about when and how much medicine is given. What are specific medicines and procedures that provide pain relief? Local Anesthetics Local anesthetics are used to numb a small area of the body. They may be used along with another kind of anesthetic or used to numb the nerves of the vagina, cervix, and perineum during the second stage of labor. General Anesthetics General anesthetics cause you to lose consciousness so you do not feel pain. They are usually only used for an emergency cesarean delivery. General anesthetics are given through an IV tube and a mask. Pudendal Block A pudendal block is a form of local anesthetic. It may be used to relieve the pain associated with pushing or stretching of the perineum at the time of delivery or to further numb the perineum. A pudendal block is done by injecting numbing  medicine through the vaginal wall into a nerve in the pelvis. Epidural Analgesia Epidural analgesia is given through a flexible IV catheter that is inserted into the lower back. Numbing medicine is delivered continuously to the area near your spinal column nerves (epidural space). After having this type of analgesia, you may be able to move your legs but you most likely will not be able to walk. Depending on the amount of medicine given, you may lose all feeling in the lower half of your body, or you may retain some level of sensation, including the urge to push. Epidural analgesia can be used to  provide pain relief for a vaginal birth. Spinal Block A spinal block is similar to epidural analgesia, but the medicine is injected into the spinal fluid instead of the epidural space. A spinal block is only given once. It starts to relieve pain quickly, but the pain relief lasts only 1-6 hours. Spinal blocks can be used for cesarean deliveries. Combined Spinal-Epidural (CSE) Block A CSE block combines the effects of a spinal block and epidural analgesia. The spinal block works quickly to block all pain. The epidural analgesia provides continuous pain relief, even after the effects of the spinal block have worn off. This information is not intended to replace advice given to you by your health care provider. Make sure you discuss any questions you have with your health care provider. Document Released: 05/03/2008 Document Revised: 06/24/2015 Document Reviewed: 06/08/2015 Elsevier Interactive Patient Education  2018 [employer]  Before Baby Comes Home Before your baby arrives it is important to:  Have all of the supplies that you will need to care for your baby.  Know where to go if there is an emergency.  Discuss the baby's arrival with other family members.  What supplies will I need?  It is recommended that you have the following supplies: Large Items  Crib.  Crib mattress.  Rear-facing infant car seat. If possible, have a trained professional check to make sure that it is installed correctly.  Feeding  6-8 bottles that are 4-5 oz in size.  6-8 nipples.  Bottle brush.  Sterilizer, or a large pan or kettle with a lid.  A way to boil and cool water.  If you will be breastfeeding: ? Breast pump. ? Nipple cream. ? Nursing bra. ? Breast pads. ? Breast shields.  If you will be formula feeding: ? Formula. ? Measuring cups. ? Measuring spoons.  Bathing  Mild baby soap and baby shampoo.  Petroleum jelly.  Soft cloth towel and washcloth.  Hooded  towel.  Cotton balls.  Bath basin.  Other Supplies  Rectal thermometer.  Bulb syringe.  Baby wipes or washcloths for diaper changes.  Diaper bag.  Changing pad.  Clothing, including one-piece outfits and pajamas.  Baby nail clippers.  Receiving blankets.  Mattress pad and sheets for the crib.  Night-light for the baby's room.  Baby monitor.  2 or 3 pacifiers.  Either 24-36 cloth diapers and waterproof diaper covers or a box of disposable diapers. You may need to use as many as 10-12 diapers per day.  How do I prepare for an emergency? Prepare for an emergency by:  Knowing how to get to the nearest hospital.  Listing the phone numbers of your baby's health care providers near your home phone and in your cell phone.  How do I prepare my family?  Decide how to handle visitors.  If you have other children: ? Talk with them about the  baby coming home. Ask them how they feel about it. ? Read a book together about being a new big brother or sister. ? Find ways to let them help you prepare for the new baby. ? Have someone ready to care for them while you are in the hospital. This information is not intended to replace advice given to you by your health care provider. Make sure you discuss any questions you have with your health care provider. Document Released: 12/29/2007 Document Revised: 06/23/2015 Document Reviewed: 12/23/2013 Elsevier Interactive Patient Education  2018 [employer].  Vaginal Delivery Vaginal delivery means that you will give birth by pushing your baby out of your birth canal (vagina). A team of health care providers will help you before, during, and after vaginal delivery. Birth experiences are unique for every woman and every pregnancy, and birth experiences vary depending on where you choose to give birth. What should I do to prepare for my baby's birth? Before your baby is born, it is important to talk with your health care provider  about:  Your labor and delivery preferences. These may include: ? Medicines that you may be given. ? How you will manage your pain. This might include non-medical pain relief techniques or injectable pain relief such as epidural analgesia. ? How you and your baby will be monitored during labor and delivery. ? Who may be in the labor and delivery room with you. ? Your feelings about surgical delivery of your baby (cesarean delivery, or C-section) if this becomes necessary. ? Your feelings about receiving donated blood through an IV tube (blood transfusion) if this becomes necessary.  Whether you are able: ? To take pictures or videos of the birth. ? To eat during labor and delivery. ? To move around, walk, or change positions during labor and delivery.  What to expect after your baby is born, such as: ? Whether delayed umbilical cord clamping and cutting is offered. ? Who will care for your baby right after birth. ? Medicines or tests that may be recommended for your baby. ? Whether breastfeeding is supported in your hospital or birth center. ? How long you will be in the hospital or birth center.  How any medical conditions you have may affect your baby or your labor and delivery experience.  To prepare for your baby's birth, you should also:  Attend all of your health care visits before delivery (prenatal visits) as recommended by your health care provider. This is important.  Prepare your home for your baby's arrival. Make sure that you have: ? Diapers. ? Baby clothing. ? Feeding equipment. ? Safe sleeping arrangements for you and your baby.  Install a car seat in your vehicle. Have your car seat checked by a certified car seat installer to make sure that it is installed safely.  Think about who will help you with your new baby at home for at least the first several weeks after delivery.  What can I expect when I arrive at the birth center or hospital? Once you are in labor  and have been admitted into the hospital or birth center, your health care provider may:  Review your pregnancy history and any concerns you have.  Insert an IV tube into one of your veins. This is used to give you fluids and medicines.  Check your blood pressure, pulse, temperature, and heart rate (vital signs).  Check whether your bag of water (amniotic sac) has broken (ruptured).  Talk with you about your birth plan  and discuss pain control options.  Monitoring Your health care provider may monitor your contractions (uterine monitoring) and your baby's heart rate (fetal monitoring). You may need to be monitored:  Often, but not continuously (intermittently).  All the time or for long periods at a time (continuously). Continuous monitoring may be needed if: ? You are taking certain medicines, such as medicine to relieve pain or make your contractions stronger. ? You have pregnancy or labor complications.  Monitoring may be done by:  Placing a special stethoscope or a handheld monitoring device on your abdomen to check your baby's heartbeat, and feeling your abdomen for contractions. This method of monitoring does not continuously record your baby's heartbeat or your contractions.  Placing monitors on your abdomen (external monitors) to record your baby's heartbeat and the frequency and length of contractions. You may not have to wear external monitors all the time.  Placing monitors inside of your uterus (internal monitors) to record your baby's heartbeat and the frequency, length, and strength of your contractions. ? Your health care provider may use internal monitors if he or she needs more information about the strength of your contractions or your baby's heart rate. ? Internal monitors are put in place by passing a thin, flexible wire through your vagina and into your uterus. Depending on the type of monitor, it may remain in your uterus or on your baby's head until birth. ? Your  health care provider will discuss the benefits and risks of internal monitoring with you and will ask for your permission before inserting the monitors.  Telemetry. This is a type of continuous monitoring that can be done with external or internal monitors. Instead of having to stay in bed, you are able to move around during telemetry. Ask your health care provider if telemetry is an option for you.  Physical exam Your health care provider may perform a physical exam. This may include:  Checking whether your baby is positioned: ? With the head toward your vagina (head-down). This is most common. ? With the head toward the top of your uterus (head-up or breech). If your baby is in a breech position, your health care provider may try to turn your baby to a head-down position so you can deliver vaginally. If it does not seem that your baby can be born vaginally, your provider may recommend surgery to deliver your baby. In rare cases, you may be able to deliver vaginally if your baby is head-up (breech delivery). ? Lying sideways (transverse). Babies that are lying sideways cannot be delivered vaginally.  Checking your cervix to determine: ? Whether it is thinning out (effacing). ? Whether it is opening up (dilating). ? How low your baby has moved into your birth canal.  What are the three stages of labor and delivery?  Normal labor and delivery is divided into the following three stages: Stage 1  Stage 1 is the longest stage of labor, and it can last for hours or days. Stage 1 includes: ? Early labor. This is when contractions may be irregular, or regular and mild. Generally, early labor contractions are more than 10 minutes apart. ? Active labor. This is when contractions get longer, more regular, more frequent, and more intense. ? The transition phase. This is when contractions happen very close together, are very intense, and may last longer than during any other part of  labor.  Contractions generally feel mild, infrequent, and irregular at first. They get stronger, more frequent (about every 2-3 minutes),  and more regular as you progress from early labor through active labor and transition.  Many women progress through stage 1 naturally, but you may need help to continue making progress. If this happens, your health care provider may talk with you about: ? Rupturing your amniotic sac if it has not ruptured yet. ? Giving you medicine to help make your contractions stronger and more frequent.  Stage 1 ends when your cervix is completely dilated to 4 inches (10 cm) and completely effaced. This happens at the end of the transition phase. Stage 2  Once your cervix is completely effaced and dilated to 4 inches (10 cm), you may start to feel an urge to push. It is common for the body to naturally take a rest before feeling the urge to push, especially if you received an epidural or certain other pain medicines. This rest period may last for up to 1-2 hours, depending on your unique labor experience.  During stage 2, contractions are generally less painful, because pushing helps relieve contraction pain. Instead of contraction pain, you may feel stretching and burning pain, especially when the widest part of your baby's head passes through the vaginal opening (crowning).  Your health care provider will closely monitor your pushing progress and your baby's progress through the vagina during stage 2.  Your health care provider may massage the area of skin between your vaginal opening and anus (perineum) or apply warm compresses to your perineum. This helps it stretch as the baby's head starts to crown, which can help prevent perineal tearing. ? In some cases, an incision may be made in your perineum (episiotomy) to allow the baby to pass through the vaginal opening. An episiotomy helps to make the opening of the vagina larger to allow more room for the baby to fit  through.  It is very important to breathe and focus so your health care provider can control the delivery of your baby's head. Your health care provider may have you decrease the intensity of your pushing, to help prevent perineal tearing.  After delivery of your baby's head, the shoulders and the rest of the body generally deliver very quickly and without difficulty.  Once your baby is delivered, the umbilical cord may be cut right away, or this may be delayed for 1-2 minutes, depending on your baby's health. This may vary among health care providers, hospitals, and birth centers.  If you and your baby are healthy enough, your baby may be placed on your chest or abdomen to help maintain the baby's temperature and to help you bond with each other. Some mothers and babies start breastfeeding at this time. Your health care team will dry your baby and help keep your baby warm during this time.  Your baby may need immediate care if he or she: ? Showed signs of distress during labor. ? Has a medical condition. ? Was born too early (prematurely). ? Had a bowel movement before birth (meconium). ? Shows signs of difficulty transitioning from being inside the uterus to being outside of the uterus. If you are planning to breastfeed, your health care team will help you begin a feeding. Stage 3  The third stage of labor starts immediately after the birth of your baby and ends after you deliver the placenta. The placenta is an organ that develops during pregnancy to provide oxygen and nutrients to your baby in the womb.  Delivering the placenta may require some pushing, and you may have mild contractions. Breastfeeding can  stimulate contractions to help you deliver the placenta.  After the placenta is delivered, your uterus should tighten (contract) and become firm. This helps to stop bleeding in your uterus. To help your uterus contract and to control bleeding, your health care provider may: ? Give you  medicine by injection, through an IV tube, by mouth, or through your rectum (rectally). ? Massage your abdomen or perform a vaginal exam to remove any blood clots that are left in your uterus. ? Empty your bladder by placing a thin, flexible tube (catheter) into your bladder. ? Encourage you to breastfeed your baby. After labor is over, you and your baby will be monitored closely to ensure that you are both healthy until you are ready to go home. Your health care team will teach you how to care for yourself and your baby. This information is not intended to replace advice given to you by your health care provider. Make sure you discuss any questions you have with your health care provider. Document Released: 10/25/2007 Document Revised: 08/05/2015 Document Reviewed: 01/30/2015 Elsevier Interactive Patient Education  2018 [employer].

## 2016-12-10 NOTE — Progress Notes (Signed)
ROB: Patient doing well. Discussed birth plan, as well answered all general questions regarding labor and birth.  Desires circumcision.  Discussed contraception, patient will likely do progesterone-only OCPs. RTC in 1 week, for cultures at that time.

## 2016-12-10 NOTE — Progress Notes (Signed)
ROB- Pt states she is doing well, pt would like to discuss her birthing plan

## 2016-12-18 ENCOUNTER — Encounter: Payer: Self-pay | Admitting: Obstetrics and Gynecology

## 2016-12-18 ENCOUNTER — Ambulatory Visit (INDEPENDENT_AMBULATORY_CARE_PROVIDER_SITE_OTHER): Payer: BLUE CROSS/BLUE SHIELD | Admitting: Obstetrics and Gynecology

## 2016-12-18 VITALS — BP 88/50 | HR 81 | Wt 143.1 lb

## 2016-12-18 DIAGNOSIS — Z3403 Encounter for supervision of normal first pregnancy, third trimester: Secondary | ICD-10-CM

## 2016-12-18 LAB — POCT URINALYSIS DIPSTICK
Bilirubin, UA: NEGATIVE
Blood, UA: NEGATIVE
Glucose, UA: NEGATIVE
Ketones, UA: NEGATIVE
Leukocytes, UA: NEGATIVE
Nitrite, UA: NEGATIVE
Protein, UA: NEGATIVE
Spec Grav, UA: 1.02 (ref 1.010–1.025)
Urobilinogen, UA: 0.2 E.U./dL
pH, UA: 6 (ref 5.0–8.0)

## 2016-12-18 NOTE — Progress Notes (Signed)
ROB: GC/CT-GBS performed.  Patient doing well.  Tdap for grandparents discussed.  Signs and symptoms of labor discussed.

## 2016-12-20 LAB — GC/CHLAMYDIA PROBE AMP
Chlamydia trachomatis, NAA: NEGATIVE
Neisseria gonorrhoeae by PCR: NEGATIVE

## 2016-12-20 LAB — STREP GP B NAA: Strep Gp B NAA: NEGATIVE

## 2016-12-25 ENCOUNTER — Ambulatory Visit (INDEPENDENT_AMBULATORY_CARE_PROVIDER_SITE_OTHER): Payer: BLUE CROSS/BLUE SHIELD | Admitting: Obstetrics and Gynecology

## 2016-12-25 ENCOUNTER — Encounter: Payer: Self-pay | Admitting: Obstetrics and Gynecology

## 2016-12-25 VITALS — BP 97/60 | HR 84 | Wt 145.6 lb

## 2016-12-25 DIAGNOSIS — Z3403 Encounter for supervision of normal first pregnancy, third trimester: Secondary | ICD-10-CM

## 2016-12-25 LAB — POCT URINALYSIS DIPSTICK
Bilirubin, UA: NEGATIVE
Blood, UA: NEGATIVE
Glucose, UA: NEGATIVE
Ketones, UA: NEGATIVE
Leukocytes, UA: NEGATIVE
Nitrite, UA: NEGATIVE
Protein, UA: NEGATIVE
Spec Grav, UA: 1.02 (ref 1.010–1.025)
Urobilinogen, UA: 0.2 E.U./dL
pH, UA: 5 (ref 5.0–8.0)

## 2016-12-25 NOTE — Progress Notes (Signed)
ROB: Occasional mild contractions. S&S reviewed.

## 2017-01-02 ENCOUNTER — Encounter: Payer: Self-pay | Admitting: Obstetrics and Gynecology

## 2017-01-02 ENCOUNTER — Ambulatory Visit (INDEPENDENT_AMBULATORY_CARE_PROVIDER_SITE_OTHER): Payer: BLUE CROSS/BLUE SHIELD | Admitting: Obstetrics and Gynecology

## 2017-01-02 VITALS — BP 91/60 | HR 80 | Wt 146.2 lb

## 2017-01-02 DIAGNOSIS — Z3403 Encounter for supervision of normal first pregnancy, third trimester: Secondary | ICD-10-CM

## 2017-01-02 DIAGNOSIS — R5381 Other malaise: Secondary | ICD-10-CM

## 2017-01-02 LAB — POCT URINALYSIS DIPSTICK
Bilirubin, UA: NEGATIVE
Blood, UA: NEGATIVE
Glucose, UA: NEGATIVE
Ketones, UA: NEGATIVE
Leukocytes, UA: NEGATIVE
Nitrite, UA: NEGATIVE
Protein, UA: NEGATIVE
Spec Grav, UA: 1.025 (ref 1.010–1.025)
Urobilinogen, UA: 0.2 E.U./dL
pH, UA: 6 (ref 5.0–8.0)

## 2017-01-02 NOTE — Progress Notes (Signed)
ROB- Pt isc/o headache,not much of an appetite

## 2017-01-02 NOTE — Progress Notes (Signed)
ROB: Patient notes feeling general malaise.  Denies fevers/chills.  Also noting decreased appetite, but weight gain still good.  Advised on resting as needed, staying hydrated.  Given labor precautions. RTC in 1 week.

## 2017-01-03 ENCOUNTER — Telehealth (INDEPENDENT_AMBULATORY_CARE_PROVIDER_SITE_OTHER): Payer: BLUE CROSS/BLUE SHIELD

## 2017-01-03 ENCOUNTER — Other Ambulatory Visit: Payer: Self-pay | Admitting: Obstetrics and Gynecology

## 2017-01-03 DIAGNOSIS — R3915 Urgency of urination: Secondary | ICD-10-CM | POA: Diagnosis not present

## 2017-01-03 LAB — POCT URINALYSIS DIPSTICK
Bilirubin, UA: NEGATIVE
Blood, UA: NEGATIVE
Glucose, UA: NEGATIVE
Ketones, UA: NEGATIVE
Leukocytes, UA: NEGATIVE
Nitrite, UA: NEGATIVE
Protein, UA: NEGATIVE
Spec Grav, UA: <=1.005 — AB (ref 1.010–1.025)
Urobilinogen, UA: 0.2 E.U./dL
pH, UA: 6 (ref 5.0–8.0)

## 2017-01-03 NOTE — Telephone Encounter (Signed)
Neg urine dip- specimen sent for C&S. Pt instructed to call on call number if symptoms become worse or develops fever. Pt in agreement.

## 2017-01-03 NOTE — Telephone Encounter (Signed)
Pt called office to report she "can't pee". States she has had 3-4 bottles of water and some juice. When asked when the last time was she urinated she stated about an hour ago. Denies burning on urination, backache, or strong odor. Spoke with provider- have pt come in and give a urine specimen. Pt was agreeable.

## 2017-01-05 LAB — URINE CULTURE: Organism ID, Bacteria: NO GROWTH

## 2017-01-06 ENCOUNTER — Inpatient Hospital Stay
Admission: EM | Admit: 2017-01-06 | Discharge: 2017-01-09 | DRG: 807 | Disposition: A | Discharge status: Home / Self Care | Payer: BLUE CROSS/BLUE SHIELD | Attending: Obstetrics and Gynecology | Admitting: Obstetrics and Gynecology

## 2017-01-06 ENCOUNTER — Observation Stay
Admission: EM | Admit: 2017-01-06 | Discharge: 2017-01-06 | Disposition: A | Discharge status: Home / Self Care | Payer: BLUE CROSS/BLUE SHIELD | Source: Home / Self Care | Admitting: Obstetrics and Gynecology

## 2017-01-06 ENCOUNTER — Other Ambulatory Visit: Payer: Self-pay

## 2017-01-06 DIAGNOSIS — O471 False labor at or after 37 completed weeks of gestation: Secondary | ICD-10-CM

## 2017-01-06 DIAGNOSIS — O9962 Diseases of the digestive system complicating childbirth: Secondary | ICD-10-CM | POA: Diagnosis present

## 2017-01-06 DIAGNOSIS — Z3490 Encounter for supervision of normal pregnancy, unspecified, unspecified trimester: Secondary | ICD-10-CM

## 2017-01-06 DIAGNOSIS — Z3483 Encounter for supervision of other normal pregnancy, third trimester: Secondary | ICD-10-CM | POA: Diagnosis not present

## 2017-01-06 DIAGNOSIS — K219 Gastro-esophageal reflux disease without esophagitis: Secondary | ICD-10-CM | POA: Diagnosis not present

## 2017-01-06 DIAGNOSIS — Z3A39 39 weeks gestation of pregnancy: Secondary | ICD-10-CM

## 2017-01-06 NOTE — OB Triage Note (Signed)
Patient presents to triage for complaints of "loosing mucous plug", lof, back and abdominal pain that comes and goes.

## 2017-01-06 NOTE — OB Triage Note (Signed)
Pt arrived to L&D with c/o contractions. Denies leaking of fluid, decreased fetal movement, or vaginal bleeding. Did note small amount of spotting today with mucous discharge. Toco and EFM applied. Will monitor.

## 2017-01-06 NOTE — Discharge Summary (Signed)
    L&D OB Triage Note  SUBJECTIVE Darlene Ellis is a 31 y.o. G1P0000 female at 3820w1d, EDD Estimated Date of Delivery: 01/12/17 who presented to triage with complaints of contractions.  Denies rupture of membranes.  Obstetric History   G1   P0   T0   P0   A0   L0    SAB0   TAB0   Ectopic0   Multiple0   Live Births0     # Outcome Date GA Lbr Len/2nd Weight Sex Delivery Anes PTL Lv  1 Current               No medications prior to admission.     OBJECTIVE  Nursing Evaluation:   BP 131/75 (BP Location: Right Arm)   Pulse 96   Temp 97.9 F (36.6 C) (Oral)   Resp 20   LMP 04/07/2016 (Exact Date)    Findings: Rare irregular contractions.  No cervical change after more than 1.5 hours monitoring.  NST was performed and has been reviewed by me.  NST INTERPRETATION: Category I  Mode: External Baseline Rate (A): 135 bpm(FHT 135) Variability: Moderate Accelerations: 15 x 15 Decelerations: None Nonstress Test Interpretation: Reactive Overall Impression: Reassuring for gestational age Contraction Frequency (min): 3-10, irregular  ASSESSMENT Impression:  1.  Pregnancy:  G1P0000 at 4220w1d , EDD Estimated Date of Delivery: 01/12/17 2.  NST:  Reactive 3.  Patient not in labor  PLAN 1. Reassurance given 2. Discharge home with standard labor precautions given to return to L&D or call the office for problems. 3. Continue routine prenatal care.

## 2017-01-07 ENCOUNTER — Encounter: Payer: Self-pay | Admitting: Obstetrics and Gynecology

## 2017-01-07 ENCOUNTER — Inpatient Hospital Stay: Payer: BLUE CROSS/BLUE SHIELD | Admitting: Anesthesiology

## 2017-01-07 ENCOUNTER — Other Ambulatory Visit: Payer: Self-pay

## 2017-01-07 DIAGNOSIS — Z3483 Encounter for supervision of other normal pregnancy, third trimester: Secondary | ICD-10-CM | POA: Diagnosis present

## 2017-01-07 DIAGNOSIS — K219 Gastro-esophageal reflux disease without esophagitis: Secondary | ICD-10-CM | POA: Diagnosis present

## 2017-01-07 DIAGNOSIS — O9962 Diseases of the digestive system complicating childbirth: Secondary | ICD-10-CM | POA: Diagnosis present

## 2017-01-07 DIAGNOSIS — Z3A39 39 weeks gestation of pregnancy: Secondary | ICD-10-CM | POA: Diagnosis not present

## 2017-01-07 LAB — CBC
HCT: 35.7 % (ref 35.0–47.0)
Hemoglobin: 11.7 g/dL — ABNORMAL LOW (ref 12.0–16.0)
MCH: 26.7 pg (ref 26.0–34.0)
MCHC: 32.9 g/dL (ref 32.0–36.0)
MCV: 81.3 fL (ref 80.0–100.0)
Platelets: 199 10*3/uL (ref 150–440)
RBC: 4.39 MIL/uL (ref 3.80–5.20)
RDW: 15 % — ABNORMAL HIGH (ref 11.5–14.5)
WBC: 10 10*3/uL (ref 3.6–11.0)

## 2017-01-07 LAB — TYPE AND SCREEN
ABO/RH(D): A POS
Antibody Screen: NEGATIVE

## 2017-01-07 MED ORDER — OXYTOCIN 40 UNITS IN LACTATED RINGERS INFUSION - SIMPLE MED
1.0000 m[IU]/min | INTRAVENOUS | Status: DC
  Administered 2017-01-07: 2 m[IU]/min via INTRAVENOUS

## 2017-01-07 MED ORDER — BENZOCAINE-MENTHOL 20-0.5 % EX AERO
INHALATION_SPRAY | CUTANEOUS | Status: AC
  Filled 2017-01-07: qty 56

## 2017-01-07 MED ORDER — EPHEDRINE 5 MG/ML INJ
10.0000 mg | INTRAVENOUS | Status: DC | PRN
  Filled 2017-01-07: qty 2

## 2017-01-07 MED ORDER — OXYTOCIN 40 UNITS IN LACTATED RINGERS INFUSION - SIMPLE MED
INTRAVENOUS | Status: AC
  Filled 2017-01-07: qty 1000

## 2017-01-07 MED ORDER — LIDOCAINE HCL (PF) 1 % IJ SOLN
INTRAMUSCULAR | Status: AC
  Administered 2017-01-07: 30 mL
  Filled 2017-01-07: qty 30

## 2017-01-07 MED ORDER — LIDOCAINE-EPINEPHRINE (PF) 1.5 %-1:200000 IJ SOLN
INTRAMUSCULAR | Status: DC | PRN
  Administered 2017-01-07: 3 mL

## 2017-01-07 MED ORDER — LACTATED RINGERS IV SOLN
500.0000 mL | Freq: Once | INTRAVENOUS | Status: AC
  Administered 2017-01-07: 500 mL via INTRAVENOUS

## 2017-01-07 MED ORDER — PHENYLEPHRINE 40 MCG/ML (10ML) SYRINGE FOR IV PUSH (FOR BLOOD PRESSURE SUPPORT)
80.0000 ug | PREFILLED_SYRINGE | INTRAVENOUS | Status: DC | PRN
  Filled 2017-01-07: qty 5

## 2017-01-07 MED ORDER — DOCUSATE SODIUM 100 MG PO CAPS
100.0000 mg | ORAL_CAPSULE | Freq: Two times a day (BID) | ORAL | Status: DC
  Administered 2017-01-07 – 2017-01-09 (×4): 100 mg via ORAL
  Filled 2017-01-07 (×4): qty 1

## 2017-01-07 MED ORDER — MISOPROSTOL 200 MCG PO TABS
ORAL_TABLET | ORAL | Status: AC
  Filled 2017-01-07: qty 4

## 2017-01-07 MED ORDER — OXYTOCIN 10 UNIT/ML IJ SOLN
INTRAMUSCULAR | Status: AC
  Filled 2017-01-07: qty 2

## 2017-01-07 MED ORDER — DIPHENHYDRAMINE HCL 25 MG PO CAPS: 25.0000 mg | ORAL_CAPSULE | Freq: Four times a day (QID) | ORAL | Status: DC | PRN

## 2017-01-07 MED ORDER — BUTORPHANOL TARTRATE 1 MG/ML IJ SOLN
1.0000 mg | INTRAMUSCULAR | Status: DC | PRN
  Administered 2017-01-07: 1 mg via INTRAVENOUS
  Filled 2017-01-07: qty 1

## 2017-01-07 MED ORDER — SIMETHICONE 80 MG PO CHEW: 80.0000 mg | CHEWABLE_TABLET | ORAL | Status: DC | PRN

## 2017-01-07 MED ORDER — IBUPROFEN 600 MG PO TABS
600.0000 mg | ORAL_TABLET | Freq: Four times a day (QID) | ORAL | Status: DC
  Administered 2017-01-07 – 2017-01-09 (×8): 600 mg via ORAL
  Filled 2017-01-07 (×8): qty 1

## 2017-01-07 MED ORDER — LACTATED RINGERS IV SOLN
INTRAVENOUS | Status: DC
  Administered 2017-01-07: 125 mL/h via INTRAVENOUS
  Administered 2017-01-07: 07:00:00 via INTRAVENOUS

## 2017-01-07 MED ORDER — OXYTOCIN 40 UNITS IN LACTATED RINGERS INFUSION - SIMPLE MED: 2.5000 [IU]/h | INTRAVENOUS | Status: DC

## 2017-01-07 MED ORDER — OXYCODONE-ACETAMINOPHEN 5-325 MG PO TABS: 1.0000 | ORAL_TABLET | ORAL | Status: DC | PRN

## 2017-01-07 MED ORDER — OXYTOCIN BOLUS FROM INFUSION
500.0000 mL | Freq: Once | INTRAVENOUS | Status: AC
  Administered 2017-01-07: 500 mL via INTRAVENOUS

## 2017-01-07 MED ORDER — SODIUM CHLORIDE 0.9 % IV SOLN
INTRAVENOUS | Status: DC | PRN
  Administered 2017-01-07 (×3): 5 mL via EPIDURAL

## 2017-01-07 MED ORDER — AMMONIA AROMATIC IN INHA
RESPIRATORY_TRACT | Status: AC
  Filled 2017-01-07: qty 10

## 2017-01-07 MED ORDER — BENZOCAINE-MENTHOL 20-0.5 % EX AERO: 1.0000 "application " | INHALATION_SPRAY | CUTANEOUS | Status: DC | PRN

## 2017-01-07 MED ORDER — PRENATAL MULTIVITAMIN CH
1.0000 | ORAL_TABLET | Freq: Every day | ORAL | Status: DC
  Administered 2017-01-08 – 2017-01-09 (×2): 1 via ORAL
  Filled 2017-01-07 (×2): qty 1

## 2017-01-07 MED ORDER — FENTANYL 2.5 MCG/ML W/ROPIVACAINE 0.15% IN NS 100 ML EPIDURAL (ARMC)
12.0000 mL/h | EPIDURAL | Status: DC
  Administered 2017-01-07: 12 mL/h via EPIDURAL
  Filled 2017-01-07: qty 100

## 2017-01-07 MED ORDER — LACTATED RINGERS IV SOLN
500.0000 mL | INTRAVENOUS | Status: DC | PRN
  Administered 2017-01-07: 500 mL via INTRAVENOUS

## 2017-01-07 MED ORDER — OXYTOCIN 40 UNITS IN LACTATED RINGERS INFUSION - SIMPLE MED
2.5000 [IU]/h | INTRAVENOUS | Status: DC | PRN
  Filled 2017-01-07: qty 1000

## 2017-01-07 MED ORDER — ACETAMINOPHEN 325 MG PO TABS: 650.0000 mg | ORAL_TABLET | ORAL | Status: DC | PRN

## 2017-01-07 MED ORDER — DIPHENHYDRAMINE HCL 50 MG/ML IJ SOLN: 12.5000 mg | INTRAMUSCULAR | Status: DC | PRN

## 2017-01-07 MED ORDER — ZOLPIDEM TARTRATE 5 MG PO TABS: 5.0000 mg | ORAL_TABLET | Freq: Every evening | ORAL | Status: DC | PRN

## 2017-01-07 MED ORDER — IBUPROFEN 800 MG PO TABS
ORAL_TABLET | ORAL | Status: AC
  Administered 2017-01-07: 800 mg
  Filled 2017-01-07: qty 1

## 2017-01-07 NOTE — Lactation Note (Signed)
This note was copied from a baby's chart. Lactation Consultation Note Assisted mom with breast feeding for third time.  First feeding was poor with August Saucerean pushing away and coming on and off the breast screaming.  August SaucerDean has bruised edematous head from vacuum extraction which appears to be very painful when touched.  Demonstrated to mom how to position him to keep any pressure off his head and how to sandwich breast for deeper latch with nose and chin touching the breast, but still has tendency to push off breast resorting to shallow latch.  Second feeding was somewhat better with him being able to sustain latch for longer periods.  Third feeding was the best so far achieving a deeper latch with him able to maintain latch better.  Encouraged mom to ask for help with feedings tonight if needed.  Reviewed normal course of lactation and routine newborn feeding patterns.     Patient Name: Darlene Loann QuillJocel Sesma ZOXWR'UToday's Date: 01/07/2017 Reason for consult: Follow-up assessment;Mother's request;Difficult latch;Primapara;Term;Other (Comment)(Bruised head from vacuum extraction)  Maternal Data Formula Feeding for Exclusion: No Has patient been taught Hand Expression?: Yes Does the patient have breastfeeding experience prior to this delivery?: No  Feeding Feeding Type: Breast Fed Length of feed: 20 min  LATCH Score Latch: Repeated attempts needed to sustain latch, nipple held in mouth throughout feeding, stimulation needed to elicit sucking reflex.  Audible Swallowing: A few with stimulation  Type of Nipple: Everted at rest and after stimulation  Comfort (Breast/Nipple): Soft / non-tender  Hold (Positioning): Assistance needed to correctly position infant at breast and maintain latch.  LATCH Score: 7  Interventions Interventions: Assisted with latch;Breast massage;Breast compression;Adjust position;Support pillows;Position options  Lactation Tools Discussed/Used WIC Program: No(BCBS)   Consult  Status Consult Status: Follow-up Follow-up type: Call as needed    Darlene Ellis, Darlene Ellis 01/07/2017, 8:28 PM

## 2017-01-07 NOTE — Anesthesia Preprocedure Evaluation (Signed)
Anesthesia Evaluation  Patient identified by MRN, date of birth, ID band Patient awake    Reviewed: Allergy & Precautions, NPO status , Patient's Chart, lab work & pertinent test results  History of Anesthesia Complications Negative for: history of anesthetic complications  Airway Mallampati: II  TM Distance: >3 FB Neck ROM: Full    Dental no notable dental hx.    Pulmonary neg pulmonary ROS, neg sleep apnea, neg COPD,    breath sounds clear to auscultation- rhonchi (-) wheezing      Cardiovascular Exercise Tolerance: Good (-) hypertension(-) CAD and (-) Past MI  Rhythm:Regular Rate:Normal - Systolic murmurs and - Diastolic murmurs    Neuro/Psych negative neurological ROS  negative psych ROS   GI/Hepatic Neg liver ROS, GERD  ,  Endo/Other  negative endocrine ROSneg diabetes  Renal/GU negative Renal ROS     Musculoskeletal negative musculoskeletal ROS (+)   Abdominal (+) - obese, Gravid abdomen  Peds  Hematology negative hematology ROS (+)   Anesthesia Other Findings Past Medical History: No date: Endometrial polyp No date: UTI (lower urinary tract infection) No date: Yeast infection   Reproductive/Obstetrics (+) Pregnancy                             Lab Results  Component Value Date   WBC 10.0 01/07/2017   HGB 11.7 (L) 01/07/2017   HCT 35.7 01/07/2017   MCV 81.3 01/07/2017   PLT 199 01/07/2017    Anesthesia Physical Anesthesia Plan  ASA: II  Anesthesia Plan: Epidural   Post-op Pain Management:    Induction:   PONV Risk Score and Plan: 2  Airway Management Planned:   Additional Equipment:   Intra-op Plan:   Post-operative Plan:   Informed Consent: I have reviewed the patients History and Physical, chart, labs and discussed the procedure including the risks, benefits and alternatives for the proposed anesthesia with the patient or authorized representative who has  indicated his/her understanding and acceptance.     Plan Discussed with: CRNA and Anesthesiologist  Anesthesia Plan Comments: (Plan for epidural for labor, discussed epidural vs spinal vs GA if need for csection)        Anesthesia Quick Evaluation

## 2017-01-07 NOTE — H&P (Signed)
History and Physical   HPI  Darlene Ellis is a 31 y.o. G1P0000 at 3057w2d Estimated Date of Delivery: 01/12/17 who is being admitted for  labor management   OB History  Obstetric History   G1   P0   T0   P0   A0   L0    SAB0   TAB0   Ectopic0   Multiple0   Live Births0     # Outcome Date GA Lbr Len/2nd Weight Sex Delivery Anes PTL Lv  1 Current               PROBLEM LIST  Pregnancy complications or risks: Patient Active Problem List   Diagnosis Date Noted  . Labor and delivery indication for care or intervention 01/06/2017  . Urinary urgency 01/03/2017  . Gastroesophageal reflux in pregnancy 10/03/2016  . Pregnancy 09/21/2016  . Skin cyst 09/21/2016  . Right upper quadrant pain 09/21/2016  . PCO (polycystic ovaries) 10/18/2015    Prenatal labs and studies: ABO, Rh: --/--/A POS (12/10 0314) Antibody: NEG (12/10 0314) Rubella: 9.73 (04/18 0954) RPR: Non Reactive (04/18 0954)  HBsAg: Negative (04/18 0954)  HIV:    ZOX:WRUEAVWUGBS:Negative (11/20 1429)   Past Medical History:  Diagnosis Date  . Endometrial polyp   . UTI (lower urinary tract infection)   . Yeast infection      Past Surgical History:  Procedure Laterality Date  . DILATION AND CURETTAGE OF UTERUS       Medications      Medication List    ASK your doctor about these medications   cholecalciferol 1000 units tablet Commonly known as:  VITAMIN D   prenatal multivitamin Tabs tablet   ranitidine 150 MG tablet Commonly known as:  ZANTAC Take 1 tablet (150 mg total) by mouth 2 (two) times daily.        Allergies  Patient has no known allergies.  Review of Systems  Pertinent items noted in HPI and remainder of comprehensive ROS otherwise negative.  Physical Exam  BP 106/68   Pulse 83   Temp 97.6 F (36.4 C) (Oral)   Resp 16   Ht 4\' 11"  (1.499 m)   Wt 146 lb (66.2 kg)   LMP 04/07/2016 (Exact Date)   SpO2 98%   BMI 29.49 kg/m   Lungs:  CTA B Cardio: RRR without M/R/G Abd: Soft,  gravid, NT Presentation: cephalic EXT: No C/C/ 1+ Edema DTRs: 2+ B CERVIX: 4cm  NE  See Prenatal records for more detailed PE.  FHR:  Variability: Good {> 6 bpm)  Toco: Uterine Contractions: Q3-4 min   Test Results  Results for orders placed or performed during the hospital encounter of 01/06/17 (from the past 24 hour(s))  CBC     Status: Abnormal   Collection Time: 01/07/17  3:14 AM  Result Value Ref Range   WBC 10.0 3.6 - 11.0 K/uL   RBC 4.39 3.80 - 5.20 MIL/uL   Hemoglobin 11.7 (L) 12.0 - 16.0 g/dL   HCT 98.135.7 19.135.0 - 47.847.0 %   MCV 81.3 80.0 - 100.0 fL   MCH 26.7 26.0 - 34.0 pg   MCHC 32.9 32.0 - 36.0 g/dL   RDW 29.515.0 (H) 62.111.5 - 30.814.5 %   Platelets 199 150 - 440 K/uL  Type and screen [facility]     Status: None   Collection Time: 01/07/17  3:14 AM  Result Value Ref Range   ABO/RH(D) A POS    Antibody  Screen NEG    Sample Expiration 01/10/2017    Group B Strep negative  Assessment   G1P0000 at 3014w2d Estimated Date of Delivery: 01/12/17  The fetus is reassuring.   Patient Active Problem List   Diagnosis Date Noted  . Labor and delivery indication for care or intervention 01/06/2017  . Urinary urgency 01/03/2017  . Gastroesophageal reflux in pregnancy 10/03/2016  . Pregnancy 09/21/2016  . Skin cyst 09/21/2016  . Right upper quadrant pain 09/21/2016  . PCO (polycystic ovaries) 10/18/2015    Plan  1. Admit to L&D :   anticipate vaginal delivery 2. EFM: -- Category 1 3. Epidural if desired. Stadol for IV pain until epidural requested. 4. Admission labs    Elonda Huskyavid J. Hue Frick, M.D. 01/07/2017 7:32 AM

## 2017-01-07 NOTE — Progress Notes (Signed)
[facility]. Toco and EFM applied. Will monitor closely.

## 2017-01-07 NOTE — Anesthesia Procedure Notes (Signed)
Epidural Patient location during procedure: OB Start time: 01/07/2017 4:03 AM End time: 01/07/2017 4:20 AM  Staffing Anesthesiologist: Alver FisherPenwarden, Josemanuel Eakins, MD Performed: anesthesiologist   Preanesthetic Checklist Completed: patient identified, site marked, surgical consent, pre-op evaluation, timeout performed, IV checked, risks and benefits discussed and monitors and equipment checked  Epidural Patient position: sitting Prep: ChloraPrep Patient monitoring: heart rate, continuous pulse ox and blood pressure Approach: midline Location: L3-L4 Injection technique: LOR saline  Needle:  Needle type: Tuohy  Needle gauge: 18 G Needle length: 9 cm and 9 Needle insertion depth: 4 cm Catheter type: closed end flexible Catheter size: 20 Guage Catheter at skin depth: 8 cm Test dose: negative (0.125% bupivacaine)  Assessment Events: blood not aspirated, injection not painful, no injection resistance, negative IV test and no paresthesia  Additional Notes   Patient tolerated the insertion well without complications.Reason for block:procedure for pain

## 2017-01-08 LAB — RPR: RPR Ser Ql: NONREACTIVE

## 2017-01-08 MED ORDER — WHITE PETROLATUM EX OINT: 1.0000 "application " | TOPICAL_OINTMENT | CUTANEOUS | Status: DC | PRN

## 2017-01-08 MED ORDER — LIDOCAINE 1% INJECTION FOR CIRCUMCISION
0.8000 mL | INJECTION | Freq: Once | INTRAVENOUS | Status: DC
  Filled 2017-01-08: qty 1

## 2017-01-08 MED ORDER — SUCROSE 24% NICU/PEDS ORAL SOLUTION
0.5000 mL | OROMUCOSAL | Status: DC | PRN
  Filled 2017-01-08: qty 0.5

## 2017-01-08 NOTE — Anesthesia Postprocedure Evaluation (Signed)
Anesthesia Post Note  Patient: Darlene Ellis  Procedure(s) Performed: AN AD HOC LABOR EPIDURAL  Patient location during evaluation: Mother Baby Anesthesia Type: Epidural Level of consciousness: awake and alert Pain management: pain level controlled Vital Signs Assessment: post-procedure vital signs reviewed and stable Respiratory status: spontaneous breathing, nonlabored ventilation and respiratory function stable Cardiovascular status: stable Postop Assessment: no headache and epidural receding Anesthetic complications: no     Last Vitals:  Vitals:   01/08/17 0519 01/08/17 0730  BP: (!) 101/45 (!) 103/55  Pulse: 97 88  Resp: 18 18  Temp: 36.5 C 36.6 C  SpO2: 100% 100%    Last Pain:  Vitals:   01/08/17 0730  TempSrc: Oral  PainSc:                  Michaele OfferSavage,  Mikahla Wisor A

## 2017-01-08 NOTE — Progress Notes (Signed)
Patient ID: Loann QuillJocel Mazurowski, female   DOB: 04/16/1985, 31 y.o.   MRN: [id number] Progress Note - Vaginal Delivery  Shaniece Constance GoltzOlson is a 31 y.o. G1P1001 now PP day 1 s/p Vaginal, Vacuum (Extractor) .   Subjective:  The patient reports no complaints, up ad lib, voiding and tolerating PO  Objective:  Vital signs in last 24 hours: Temp:  [97.7 F (36.5 C)-98.3 F (36.8 C)] 98.3 F (36.8 C) (12/11 1128) Pulse Rate:  [88-132] 90 (12/11 1128) Resp:  [18] 18 (12/11 1128) BP: (92-118)/(41-91) 118/60 (12/11 1128) SpO2:  [100 %] 100 % (12/11 0730)  Physical Exam:  General: alert, cooperative and no distress Lochia: appropriate Uterine Fundus: firm DVT Evaluation: No evidence of DVT seen on physical exam.    Data Review Recent Labs    01/07/17 0314  HGB 11.7*  HCT 35.7    Assessment/Plan: Active Problems:   Pregnancy   Plan for discharge tomorrow  -- Continue routine PP care.     Elonda Huskyavid J. Vici Novick, M.D. 01/08/2017 12:27 PM

## 2017-01-09 ENCOUNTER — Ambulatory Visit: Payer: Self-pay

## 2017-01-09 NOTE — Lactation Note (Signed)
This note was copied from a baby's chart. Lactation Consultation Note  Patient Name: Darlene Ellis    Addendum: Plan-      Breastfeed on one side with at least 10 ml in sns Q2-3 hours while pumping other breast for 15 minutes with hand expression/massage. Increase volumes as needed til not cuing and good diaper counts.  If sns does not continue to work well in feeding baby, may try slow flow bottle (then pump both breasts)   Maternal Data    Feeding Feeding Type: Donor Breast Milk Length of feed: 20 min  LATCH Score                   Interventions    Lactation Tools Discussed/Used     Consult Status      Sunday CornSandra Clark Delorean Knutzen Ellis, 5:51 PM

## 2017-01-09 NOTE — Lactation Note (Signed)
This note was copied from a baby's chart. Lactation Consultation Note  Patient Name: Darlene Loann QuillJocel Salva WUJWJ'XToday's Date: 01/09/2017    Last night, I gave Mom nipple shield to try over night to help with feedings as she struggled to get him to latch and nurse well without RN or LC help. Even with help, he struggled to maintain latch and nutritive sucking. He does have a bit of a tight lingual frenulum where he does bite down during finger exam, but tongue has extends a bit past gumline and a little lateral movement. Lips do not flange as well as they probably should. The nipple shield helped with both last night. It did not seem to help him be satisfied at breast. Mom tried to nurse him often, but he still rooted and cried a lot.   This morning, we reminded mom of hand expression and taught her to use breast pump. No colostrum obtained. Mom's nipples were becoming sore and parents getting discouraged as baby is obviously actin hungry. Options were reviewed. They chose human donor milk and signed consent form. I drew it up in curved syringe at first and inserted it into nipple shield. It did help him organize his suck-swallow better and Mom said she only felt strong tugs, no pain. She has amazing family support and whole family wants mom to be successful with breastfeeding, so I taught them the SNS. The mother-in-law did the whole set up and application to breast correctly after instructions given. Baby fed well today with SNS and was content after 10-15 ml given each time. Parents went home with remaining bottle of donor milk. They know they can use formula if mom's milk is still lagging when baby needs more until her supply picks up.   I did discuss what tongue and lip ties are, but that I cannot diagnose them. I gave them Dr. Rachelle HoraHisaw's info in case they feel the need to assessment/dianosis/treatment from a specialist.   [facility]   Maternal Data     Feeding Feeding Type: Donor Breast Milk Length of feed: 20 min  LATCH Score                   Interventions    Lactation Tools Discussed/Used     Consult Status      Darlene Ellis 01/09/2017, 5:08 PM

## 2017-01-09 NOTE — Progress Notes (Signed)
Patient discharged home with infant and significant other. Discharge instructions, prescriptions and follow up appointment given to and reviewed with patient and significant other. Patient verbalized understanding. Escorted out via wheelchair by nursing staff.  

## 2017-01-09 NOTE — Discharge Summary (Signed)
                              Discharge Summary  Date of Admission: 01/06/2017  Date of Discharge: 01/09/2017  Admitting Diagnosis: Onset of Labor at 4471w2d  Mode of Delivery: vacuum-assisted vaginal delivery                 Discharge Diagnosis: No other diagnosis   Intrapartum Procedures: epidural   Post partum procedures:   Complications: none                      Discharge Day SOAP Note:  Progress Note - Vaginal Delivery  Darlene Ellis is a 31 y.o. G1P1001 now PP day 2 s/p Vaginal, Vacuum (Extractor) . Delivery was complicated by second stage decelerations  Subjective  The patient has the following complaints: has no unusual complaints  Pain is controlled with current medications.   Patient is urinating without difficulty.  She is ambulating well.    Objective  Vital signs: BP 106/61 (BP Location: Left Arm)   Pulse 82   Temp 97.9 F (36.6 C) (Oral)   Resp 18   Ht 4\' 11"  (1.499 m)   Wt 146 lb (66.2 kg)   LMP 04/07/2016 (Exact Date)   SpO2 100%   Breastfeeding? Unknown   BMI 29.49 kg/m   Physical Exam: Gen: NAD Fundus Fundal Tone: Firm  Lochia Amount: Scant  Perineum Appearance: Approximated     Data Review Labs: CBC Latest Ref Rng & Units 01/07/2017 10/25/2016 05/16/2016  WBC 3.6 - 11.0 K/uL 10.0 9.3 6.6  Hemoglobin 12.0 - 16.0 g/dL 11.7(L) 11.8 13.8  Hematocrit 35.0 - 47.0 % 35.7 34.8 41.7  Platelets 150 - 440 K/uL 199 192 -   A POS  Assessment/Plan  Active Problems:   Pregnancy    Plan for discharge today.   Discharge Instructions: Per After Visit Summary. Activity: Advance as tolerated. Pelvic rest for 6 weeks.  Also refer to After Visit Summary Diet: Regular Medications: Allergies as of 01/09/2017   No Known Allergies     Medication List    STOP taking these medications   cholecalciferol 1000 units tablet Commonly known as:  VITAMIN D     TAKE these medications   prenatal multivitamin Tabs tablet Take 1 tablet by mouth daily  at 12 noon.   ranitidine 150 MG tablet Commonly known as:  ZANTAC Take 1 tablet (150 mg total) by mouth 2 (two) times daily.      Outpatient follow up:  Follow-up Information    Linzie CollinEvans, Enora Trillo James, MD Follow up.   Specialty:  Obstetrics and Gynecology Contact information: [address] [place] [address] [phone]          Postpartum contraception: Will discuss at first office visit post-partum  Discharged Condition: good  Discharged to: home  Newborn Data: Disposition:home with mother  Apgars: APGAR (1 MIN): 8   APGAR (5 MINS): 9   APGAR (10 MINS):    Baby Feeding: Breast    Elonda Huskyavid J. Mariafernanda Hendricksen, M.D. 01/09/2017 1:23 PM

## 2017-01-10 ENCOUNTER — Encounter: Payer: BLUE CROSS/BLUE SHIELD | Admitting: Obstetrics and Gynecology

## 2017-01-14 ENCOUNTER — Telehealth: Payer: Self-pay | Admitting: *Deleted

## 2017-01-14 ENCOUNTER — Telehealth: Payer: Self-pay

## 2017-01-14 NOTE — Telephone Encounter (Signed)
Patient husband left message on Friday I check the voicemail this AM and the husband states that she was having dizziness and headache . He states the patient is still having some of those symptoms but they are intermittent. He also states she is having some pain with her episiotomy site  She has hemorrhoids so they are doing the sitz bath ann also taking pain medication  but she is still in some pain. He is wondering if she needs an appt for an incision check . Patient is requesting a call back. [phone] Please advise. Thank you

## 2017-01-14 NOTE — Telephone Encounter (Signed)
Pt states she feels better now. She asked when she would know when the pain stops. Encouraged use of tylenol. Also she could get a "donut" to sit on to relieve pressure. Encouraged to call with any questions or concerns.

## 2017-02-19 ENCOUNTER — Encounter: Payer: Self-pay | Admitting: Obstetrics and Gynecology

## 2017-02-19 ENCOUNTER — Ambulatory Visit (INDEPENDENT_AMBULATORY_CARE_PROVIDER_SITE_OTHER): Payer: BLUE CROSS/BLUE SHIELD | Admitting: Obstetrics and Gynecology

## 2017-02-19 DIAGNOSIS — Z3009 Encounter for other general counseling and advice on contraception: Secondary | ICD-10-CM

## 2017-02-19 NOTE — Progress Notes (Signed)
HPI:      Ms. Loann QuillJocel Oriordan is a 32 y.o. G1P1001 who LMP was No LMP recorded.  Subjective:   She presents today emergently 6 weeks postpartum.  She is both breast and bottlefeeding but desires to switch back to breast-feeding.  She is seeking a consult from her pediatrician for the lactation consultant.  She is considering multiple different birth control methods but does not want to take birth control pills because she is afraid it will further decrease her breast milk.  She has not resumed intercourse.    Hx: The following portions of the patient's history were reviewed and updated as appropriate:             She  has a past medical history of Endometrial polyp, UTI (lower urinary tract infection), and Yeast infection. She does not have any pertinent problems on file. She  has a past surgical history that includes Dilation and curettage of uterus. Her family history includes Colon cancer in her maternal aunt; Diabetes in her paternal grandmother; Hypertension in her father; Ovarian cancer in her maternal aunt. She  reports that  has never smoked. she has never used smokeless tobacco. She reports that she does not drink alcohol or use drugs. She has No Known Allergies.       Review of Systems:  Review of Systems  Constitutional: Denied constitutional symptoms, night sweats, recent illness, fatigue, fever, insomnia and weight loss.  Eyes: Denied eye symptoms, eye pain, photophobia, vision change and visual disturbance.  Ears/Nose/Throat/Neck: Denied ear, nose, throat or neck symptoms, hearing loss, nasal discharge, sinus congestion and sore throat.  Cardiovascular: Denied cardiovascular symptoms, arrhythmia, chest pain/pressure, edema, exercise intolerance, orthopnea and palpitations.  Respiratory: Denied pulmonary symptoms, asthma, pleuritic pain, productive sputum, cough, dyspnea and wheezing.  Gastrointestinal: Denied, gastro-esophageal reflux, melena, nausea and vomiting.  Genitourinary:  Denied genitourinary symptoms including symptomatic vaginal discharge, pelvic relaxation issues, and urinary complaints.  Musculoskeletal: Denied musculoskeletal symptoms, stiffness, swelling, muscle weakness and myalgia.  Dermatologic: Denied dermatology symptoms, rash and scar.  Neurologic: Denied neurology symptoms, dizziness, headache, neck pain and syncope.  Psychiatric: Denied psychiatric symptoms, anxiety and depression.  Endocrine: Denied endocrine symptoms including hot flashes and night sweats.   Meds:   Current Outpatient Medications on File Prior to Visit  Medication Sig Dispense Refill  . Prenatal Vit-Fe Fumarate-FA (PRENATAL MULTIVITAMIN) TABS tablet Take 1 tablet by mouth daily at 12 noon.    . ranitidine (ZANTAC) 150 MG tablet Take 1 tablet (150 mg total) by mouth 2 (two) times daily. (Patient not taking: Reported on 02/19/2017) 60 tablet 4   No current facility-administered medications on file prior to visit.     Objective:     Vitals:   02/19/17 0838  BP: 96/61  Pulse: 84              Pelvic examination   Pelvic:   Vulva: Normal appearance.  No lesions.  No abnormal scarring.    Vagina: No lesions or abnormalities noted.  Support: Normal pelvic support.  Urethra No masses tenderness or scarring.  Meatus Normal size without lesions or prolapse.  Cervix: Normal ectropion.  No lesions.  Anus: Normal exam.  No lesions.  Perineum: Normal exam.  No lesions.  Healed well.          Bimanual   Uterus: Normal size.  Non-tender.  Mobile.  AV.  Adnexae: No masses.  Non-tender to palpation.  Cul-de-sac: Negative for abnormality.     Assessment:  G1P1001 Patient Active Problem List   Diagnosis Date Noted  . Labor and delivery indication for care or intervention 01/06/2017  . Urinary urgency 01/03/2017  . Gastroesophageal reflux in pregnancy 10/03/2016  . Pregnancy 09/21/2016  . Skin cyst 09/21/2016  . Right upper quadrant pain 09/21/2016  . PCO (polycystic  ovaries) 10/18/2015     1. Postpartum care and examination immediately after delivery   2. Birth control counseling        Plan:            1.  Birth Control I discussed multiple birth control options and methods with the patient.  The risks and benefits of each were reviewed. IUD Literature on [place] given.  Risks and benefits of each discussed.  She is considering IUD as an option for birth/cycle control. Patient will inform [place] of her decision regarding birth control when she has reached it. Orders No orders of the defined types were placed in this encounter.   No orders of the defined types were placed in this encounter.     F/U  No Follow-up on file.  Elonda Husky, M.D. 02/19/2017 9:29 AM

## 2017-02-27 ENCOUNTER — Ambulatory Visit: Payer: Self-pay

## 2017-02-27 NOTE — Lactation Note (Signed)
This note was copied from a baby's chart. Lactation Consultation Note  Patient Name: Darlene Ellis Today's Date: 02/27/2017   Reason for consult: Poor milk supply and poor breastfeeding. Giving approx 80-90 % formula per mom (4-5 oz per feeding). "Excellent weight gain" per Mom.   Feeding hx:   Poor latch in hospital. Some concern about tight tongue and lip tie. Needed to use 20 mm nipple shield to latch correctly. Left hospital using donor milk in SNS until her own milk increased. She states her milk volume did increase by about the 4th -5th day post partum. She stopped using SNS, but still needed nipple shield. Darlene Ellis was gaining weight well per Mom during this time.  At the 3rd week, Mom got the flu and "lost a lot of her supply", partly due to less nursing and no pumping for three days. Darlene Ellis. Darlene Ellis was also ill and became a poor feeder for a while, so they started using more bottles and then formula. She pumps close to every 1- 3 hours during day. He nurses for an hour whenever he is hungry 3-5 times a day. It still hurts when he starts to latch. Sometimes it hurts longer. Mom had been taking Fenugreek one capsule 3 times a day; then realized to take 3 capsules three times a day. Mom also got a thrush infection on her nipples and Darlene Ellis got it in his mouth right after they had the flu around the 4th week. tx ended Monday. They both took fluconazole per mom.  Additional hx: Mom reports that she bled for the whole first month off and on, including bouts of bright red bleeding. She now states she started her "period" last Friday and she is still bleeding, but still bright red. It is after 5pm, so I urged her to contact MD in the morning to report these findings. (She states she never told them during follow up exams) and get good assessment. If she has any retained placenta, it would explain the unusual bleeding and poor milk supply (<30 ml per feed at 7 week PP). She denies foul odor or fever or pelvic  area pain.   Consult assessment:  Mom: Soft breasts; bright pink nipples, otherwise intact, but very sore "all the time".  Oblong, firm lump approx 1 inch by 1/4 inch near right axilla. She states she had it before pregnancy and MD is "watching it".  It does happen to be in the ventral epidermal ridge line.  Baby: White coating on his tongue that will not wipe off. Upper lip frenulum is tight in that the lip cannot flange as it should. It tenses up and rolls inward during latch to breast or bottle. Tongue extends to gumline and a bit beyond, but flattens out when lifting up towards palate, but can only get halfway. A little lateral ability. He gets extremely fussy, even during feedings in a way that makes me believe it is sharp gas pains. Otherwise, he has good tone and color.  Breastfeeding: Mom uses proper position and latch on her boppy. She knows to try to flange lips outward, but lips curl back inward. Despite perfect position and latch, (with pre/post weight check) he only transferred 6 ml after 15 minutes of nursing. (We seldom heard any swallows). He nursed again on the other side for 20 minutes with a few more swallows, but he only transferred 7 more mls. She said this was his best feeding in a while. (She also says she nurses him  for an hour!) He screamed after the session.   Pumping/expression: Mom had used 27 mm flange a few days ago, but that is too large for her small nipples. I encouraged her to stay with 24 mm. Her pump and technique of hands on pumping were correct. She only obtained 8 ml total with hand expression after 10 minutes of pumping. She had last nursed/pumped 4 hours previously.   Bottle feeding: Difficult to obtain/maintain flanged lips on bottle. No leaking of milk around the mouth. Occasional odd sucking sounds and unusual movement of tongue. After 2 oz, he came off crying hard like he had pain. He had big burp, kept crying til he fed some more. It took approx 15-20 minutes  for him to take 4 1/2 oz  My thoughts: Thrush: still a problem for both of them.   Poor Milk Supply: Probably a combo of decreased feeds and breast stim and illness from flu with poor comeback. But even with nursing and pumping more than 8-10 times per 24 hours since that time, she should have a lot more milk than she does have. It is rare to have retained placenta, but that would explain this kind of poor milk supply and all the bleeding she has had since delivery. MD needs to assess for that.  Gas/poor latch: Combo of tight frenulums, and type of formula being used.   PLAN:  Thrush: *Ask OB  MD about All Purpose Nipple Ointment. * Ask pediatrician for antifungal. Follow handout instructions on Thrush while breastfeeding.   Milk Supply: *Ask OB MD about reglan. Also tell him/her about bleeding and if retained placenta is possible. If not, why so much bleeding? *8-10 effective" breast sessions" per 24 hours. (Since poor milk transfer at breast and lots of "sleeping" at breast per mom's account, maybe only 1-3 breastfeeding sessions and the rest hands on pumping). No more nursing for an hour.  *Rest between feeds. *24 mm Flange for pumping.  Latch: After all other problems above addressed, consider the wisdom of having frenulum assessed by specialist. Laser treatment may help tongue and lip function better so any feeding can be improved with less gas, reflux and breastfeeding issues now (and potential other issues later)  Gas: Ask MD if another type of formula should be used til her own milk supply increases.   Mom to call for F/U [facility] consult prn after these issues have been addressed.        Maternal Data    Feeding    LATCH Score                   Interventions    Lactation Tools Discussed/Used     Consult Status      Darlene Ellis 02/27/2017, 3:24 PM

## 2017-02-28 ENCOUNTER — Telehealth: Payer: Self-pay | Admitting: *Deleted

## 2017-02-28 NOTE — Telephone Encounter (Signed)
Patient called and states that she seen the lactation nurse and she stated that her and the baby has thrush and she needs to start an antibotic and also a nipple cream. Patient also stated she is still having trouble with her milk supply . Patient is unsure if she needs to see you for this issue or not.  Patient is wondering if you can call her in something to her pharmacy.  Patient pharmacy is [facility] [address]. Please advise. Thank you

## 2017-03-01 MED ORDER — FLUCONAZOLE 100 MG PO TABS: 100.0000 mg | ORAL_TABLET | Freq: Every day | ORAL | 0 refills | Status: DC

## 2017-03-01 MED ORDER — METOCLOPRAMIDE HCL 10 MG PO TABS: 10.0000 mg | ORAL_TABLET | Freq: Three times a day (TID) | ORAL | 0 refills | Status: DC

## 2017-03-01 NOTE — Telephone Encounter (Signed)
Pt aware. Meds rx. Pt requested a rx for reglan. This was advised per lactation consultant. Given. Aware if she develops depression sx to d/c. Pt is s/p 14 days diflucan from her babies peds office. Sx somewhat better. Appt made to ses DJE in 1 week to f/u on sx. Informed to contact peds for rx for baby. Pt voices understanding.

## 2017-03-07 ENCOUNTER — Ambulatory Visit (INDEPENDENT_AMBULATORY_CARE_PROVIDER_SITE_OTHER): Payer: BLUE CROSS/BLUE SHIELD | Admitting: Obstetrics and Gynecology

## 2017-03-07 ENCOUNTER — Encounter: Payer: Self-pay | Admitting: Obstetrics and Gynecology

## 2017-03-07 VITALS — BP 120/71 | HR 90 | Wt 124.7 lb

## 2017-03-07 DIAGNOSIS — B372 Candidiasis of skin and nail: Secondary | ICD-10-CM | POA: Diagnosis not present

## 2017-03-07 NOTE — Progress Notes (Signed)
HPI:      [name] is a 32 y.o. G1P1001 who LMP was No LMP recorded.  Subjective:   She presents today as a follow-up after the baby was found to have thrush and she believed that she had monilia of her nipples.  She has taken Diflucan.  She is using vinegar cleansing solution.  She has seen the lactation consultant and has been recommended to use Reglan to help with increasing breast milk.  She is still struggling with the amount of milk that she makes and still is supplementing.  She has been reluctant to begin Reglan.     Hx: The following portions of the patient's history were reviewed and updated as appropriate:             She  has a past medical history of Endometrial polyp, UTI (lower urinary tract infection), and Yeast infection. She does not have any pertinent problems on file. She  has a past surgical history that includes Dilation and curettage of uterus. Her family history includes Colon cancer in her maternal aunt; Diabetes in her paternal grandmother; Hypertension in her father; Ovarian cancer in her maternal aunt. She  reports that  has never smoked. she has never used smokeless tobacco. She reports that she does not drink alcohol or use drugs. She has a current medication list which includes the following prescription(s): fenugreek, fluconazole, prenatal multivitamin, metoclopramide, and ranitidine. She has No Known Allergies.       Review of Systems:  Review of Systems  Constitutional: Denied constitutional symptoms, night sweats, recent illness, fatigue, fever, insomnia and weight loss.  Eyes: Denied eye symptoms, eye pain, photophobia, vision change and visual disturbance.  Ears/Nose/Throat/Neck: Denied ear, nose, throat or neck symptoms, hearing loss, nasal discharge, sinus congestion and sore throat.  Cardiovascular: Denied cardiovascular symptoms, arrhythmia, chest pain/pressure, edema, exercise intolerance, orthopnea and palpitations.  Respiratory: Denied  pulmonary symptoms, asthma, pleuritic pain, productive sputum, cough, dyspnea and wheezing.  Gastrointestinal: Denied, gastro-esophageal reflux, melena, nausea and vomiting.  Genitourinary: Denied genitourinary symptoms including symptomatic vaginal discharge, pelvic relaxation issues, and urinary complaints.  Musculoskeletal: Denied musculoskeletal symptoms, stiffness, swelling, muscle weakness and myalgia.  Dermatologic: See HPI for additional information.  Neurologic: Denied neurology symptoms, dizziness, headache, neck pain and syncope.  Psychiatric: Denied psychiatric symptoms, anxiety and depression.  Endocrine: Denied endocrine symptoms including hot flashes and night sweats.   Meds:   Current Outpatient Medications on File Prior to Visit  Medication Sig Dispense Refill  . Fenugreek 500 MG CAPS Take by mouth.    . fluconazole (DIFLUCAN) 100 MG tablet Take 1 tablet (100 mg total) by mouth daily. 14 tablet 0  . Prenatal Vit-Fe Fumarate-FA (PRENATAL MULTIVITAMIN) TABS tablet Take 1 tablet by mouth daily at 12 noon.    . metoCLOPramide (REGLAN) 10 MG tablet Take 1 tablet (10 mg total) by mouth 3 (three) times daily before meals. (Patient not taking: Reported on 03/07/2017) 42 tablet 0  . ranitidine (ZANTAC) 150 MG tablet Take 1 tablet (150 mg total) by mouth 2 (two) times daily. (Patient not taking: Reported on 02/19/2017) 60 tablet 4   No current facility-administered medications on file prior to visit.     Objective:     Vitals:   03/07/17 0845  BP: 120/71  Pulse: 90              Examination of the breasts reveals no evidence of cutaneous monilia at this time.  Assessment:    G1P1001 Patient Active  Problem List   Diagnosis Date Noted  . Labor and delivery indication for care or intervention 01/06/2017  . Urinary urgency 01/03/2017  . Gastroesophageal reflux in pregnancy 10/03/2016  . Pregnancy 09/21/2016  . Skin cyst 09/21/2016  . Right upper quadrant pain 09/21/2016  .  PCO (polycystic ovaries) 10/18/2015     1. Moniliasis, cutaneous     Resolved   Plan:            1.  Patient still struggling with breast-feeding but would like to continue trying.  We have discussed the use of Reglan as previously recommended.  2.  Patient says her breasts feel better when using the vinegar solution.  3.  Discussed the use of topical antifungal cream on the breasts. Orders No orders of the defined types were placed in this encounter.   No orders of the defined types were placed in this encounter.     F/U  No Follow-up on file. I spent 16 minutes with this patient of which greater than 50% was spent discussing issues with breast-feeding centered around monilia, decreased breast milk, pumping, lactation consultant, latching etc.  Elonda Husky, M.D. 03/07/2017 9:23 AM

## 2017-03-08 DIAGNOSIS — B349 Viral infection, unspecified: Secondary | ICD-10-CM | POA: Diagnosis not present

## 2017-03-08 DIAGNOSIS — H6092 Unspecified otitis externa, left ear: Secondary | ICD-10-CM | POA: Diagnosis not present

## 2017-03-11 ENCOUNTER — Ambulatory Visit: Payer: BLUE CROSS/BLUE SHIELD | Admitting: Internal Medicine

## 2017-03-13 ENCOUNTER — Other Ambulatory Visit: Payer: Self-pay

## 2017-03-13 ENCOUNTER — Encounter: Payer: Self-pay | Admitting: Family Medicine

## 2017-03-13 ENCOUNTER — Ambulatory Visit (INDEPENDENT_AMBULATORY_CARE_PROVIDER_SITE_OTHER): Payer: BLUE CROSS/BLUE SHIELD | Admitting: Family Medicine

## 2017-03-13 DIAGNOSIS — J069 Acute upper respiratory infection, unspecified: Secondary | ICD-10-CM

## 2017-03-13 MED ORDER — LORATADINE 10 MG PO TABS: 10.0000 mg | ORAL_TABLET | Freq: Every day | ORAL | 11 refills | Status: DC

## 2017-03-13 NOTE — Progress Notes (Signed)
  Marikay AlarEric Seraphim Affinito, MD Phone: [phone]  Darlene QuillJocel Ellis is a 32 y.o. female who presents today for same-day visit.  Patient notes for the last 10 days she has had symptoms.  Started with some sore throat and postnasal drip.  Has been coughing mostly at night.  Some throat congestion though no significant chest congestion or sinus congestion.  Slight headaches at night.  Occasionally coughing up thick green mucus.  Her ears do feel full.  She was evaluated at urgent care last Friday and they advised Mucinex.  She had a negative strep test at that time per her report.  She has had no fevers.  She has had no shortness of breath.  She has breast-feeding.  Social History   Tobacco Use  Smoking Status Never Smoker  Smokeless Tobacco Never Used     ROS see history of present illness  Objective  Physical Exam Vitals:   03/13/17 0932  BP: (!) 98/54  Pulse: 66  Temp: 98.4 F (36.9 C)  SpO2: 98%    BP Readings from Last 3 Encounters:  03/13/17 (!) 98/54  03/07/17 120/71  02/19/17 96/61   Wt Readings from Last 3 Encounters:  03/13/17 126 lb 3.2 oz (57.2 kg)  03/07/17 124 lb 11.2 oz (56.6 kg)  02/19/17 128 lb 3.2 oz (58.2 kg)    Physical Exam  Constitutional: No distress.  HENT:  Head: Normocephalic and atraumatic.  Mouth/Throat: Oropharynx is clear and moist. No oropharyngeal exudate.  Normal TMs  Eyes: Conjunctivae are normal. Pupils are equal, round, and reactive to light.  Neck: Neck supple.  Cardiovascular: Normal rate, regular rhythm and normal heart sounds.  Pulmonary/Chest: Effort normal and breath sounds normal.  Musculoskeletal: She exhibits no edema.  Lymphadenopathy:    She has no cervical adenopathy.  Neurological: She is alert. Gait normal.  Skin: Skin is warm and dry. She is not diaphoretic.     Assessment/Plan: Please see individual problem list.  URI (upper respiratory infection) Symptoms most consistent with viral upper respiratory infection.  She has  no significant sinus congestion to indicate sinusitis.  Her lungs sound clear.  Unlikely pneumonia given history and exam.  Potentially could have viral bronchitis.  Discussed supportive care.  Given that she has breast-feeding and likely has a virus I would like to hold off on providing her with an antibiotic.  I reviewed up-to-date to determine what medication could be used to help symptomatically given that she is breast-feeding.  Per my review it appears Claritin is an acceptable option in breast-feeding.  She will trial this.  She notes she will also check with her sons pediatrician to make sure that is okay.  Advised her to monitor for any irritability or drowsiness in her son given that she will continue to breast-feed.  If not improving over the next several days she will contact us to consider antibiotic therapy.   No orders of the defined types were placed in this encounter.   Meds ordered this encounter  Medications  . loratadine (CLARITIN) 10 MG tablet    Sig: Take 1 tablet (10 mg total) by mouth daily.    Dispense:  30 tablet    Refill:  11     Marikay AlarEric Marchetta Navratil, MD [facility] [place]

## 2017-03-13 NOTE — Patient Instructions (Signed)
Nice to see you. You likely have a viral bronchitis.  We can try you on Claritin over-the-counter for symptom management. Please monitor your baby while on this for drowsiness or irritability. If you develop any worsening symptoms or you are not improving by early next week please let us know and we can consider antibiotic therapy.

## 2017-03-13 NOTE — Assessment & Plan Note (Signed)
Symptoms most consistent with viral upper respiratory infection.  She has no significant sinus congestion to indicate sinusitis.  Her lungs sound clear.  Unlikely pneumonia given history and exam.  Potentially could have viral bronchitis.  Discussed supportive care.  Given that she has breast-feeding and likely has a virus I would like to hold off on providing her with an antibiotic.  I reviewed up-to-date to determine what medication could be used to help symptomatically given that she is breast-feeding.  Per my review it appears Claritin is an acceptable option in breast-feeding.  She will trial this.  She notes she will also check with her sons pediatrician to make sure that is okay.  Advised her to monitor for any irritability or drowsiness in her son given that she will continue to breast-feed.  If not improving over the next several days she will contact us to consider antibiotic therapy.

## 2017-04-16 DIAGNOSIS — L728 Other follicular cysts of the skin and subcutaneous tissue: Secondary | ICD-10-CM | POA: Diagnosis not present

## 2017-06-07 ENCOUNTER — Encounter: Payer: Self-pay | Admitting: Obstetrics and Gynecology

## 2017-06-07 ENCOUNTER — Ambulatory Visit (INDEPENDENT_AMBULATORY_CARE_PROVIDER_SITE_OTHER): Payer: BLUE CROSS/BLUE SHIELD | Admitting: Obstetrics and Gynecology

## 2017-06-07 VITALS — BP 106/68 | HR 78 | Ht 59.0 in | Wt 128.0 lb

## 2017-06-07 DIAGNOSIS — Z30011 Encounter for initial prescription of contraceptive pills: Secondary | ICD-10-CM

## 2017-06-07 LAB — POCT URINE PREGNANCY: Preg Test, Ur: NEGATIVE

## 2017-06-07 MED ORDER — DESOGESTREL-ETHINYL ESTRADIOL 0.15-0.02/0.01 MG (21/5) PO TABS: 1.0000 | ORAL_TABLET | Freq: Every day | ORAL | 2 refills | Status: DC

## 2017-06-07 NOTE — Progress Notes (Signed)
Pt is present today discuss birth control. Pregnancy test neg.

## 2017-06-07 NOTE — Progress Notes (Signed)
HPI:      [name] is a 32 y.o. G1P1001 who LMP was No LMP recorded (lmp unknown).  Subjective:   She presents today because she would like to begin OCPs.  She has discontinued breast-feeding.  She has had one cycle since discontinuing breast-feeding. She reports a history of previously diagnosed PCO and being placed on OCPs for many years of her life.  She also describes a history of endometriosis which sounds as if it was surgically diagnosed.    Hx: The following portions of the patient's history were reviewed and updated as appropriate:             She  has a past medical history of Endometrial polyp, UTI (lower urinary tract infection), and Yeast infection. She does not have any pertinent problems on file. She  has a past surgical history that includes Dilation and curettage of uterus. Her family history includes Colon cancer in her maternal aunt; Diabetes in her paternal grandmother; Hypertension in her father; Ovarian cancer in her maternal aunt. She  reports that she has never smoked. She has never used smokeless tobacco. She reports that she does not drink alcohol or use drugs. She has a current medication list which includes the following prescription(s): loratadine, multivitamin, probiotic product, desogestrel-ethinyl estradiol, fenugreek, fluconazole, and prenatal multivitamin. She has No Known Allergies.       Review of Systems:  Review of Systems  Constitutional: Denied constitutional symptoms, night sweats, recent illness, fatigue, fever, insomnia and weight loss.  Eyes: Denied eye symptoms, eye pain, photophobia, vision change and visual disturbance.  Ears/Nose/Throat/Neck: Denied ear, nose, throat or neck symptoms, hearing loss, nasal discharge, sinus congestion and sore throat.  Cardiovascular: Denied cardiovascular symptoms, arrhythmia, chest pain/pressure, edema, exercise intolerance, orthopnea and palpitations.  Respiratory: Denied pulmonary symptoms, asthma,  pleuritic pain, productive sputum, cough, dyspnea and wheezing.  Gastrointestinal: Denied, gastro-esophageal reflux, melena, nausea and vomiting.  Genitourinary: Denied genitourinary symptoms including symptomatic vaginal discharge, pelvic relaxation issues, and urinary complaints.  Musculoskeletal: Denied musculoskeletal symptoms, stiffness, swelling, muscle weakness and myalgia.  Dermatologic: Denied dermatology symptoms, rash and scar.  Neurologic: Denied neurology symptoms, dizziness, headache, neck pain and syncope.  Psychiatric: Denied psychiatric symptoms, anxiety and depression.  Endocrine: Denied endocrine symptoms including hot flashes and night sweats.   Meds:   Current Outpatient Medications on File Prior to Visit  Medication Sig Dispense Refill  . loratadine (CLARITIN) 10 MG tablet Take 1 tablet (10 mg total) by mouth daily. 30 tablet 11  . Multiple Vitamin (MULTIVITAMIN) tablet Take 1 tablet by mouth daily.    . Probiotic Product (PROBIOTIC-10 PO) Take by mouth.    . Fenugreek 500 MG CAPS Take by mouth.    . fluconazole (DIFLUCAN) 100 MG tablet Take 1 tablet (100 mg total) by mouth daily. (Patient not taking: Reported on 06/07/2017) 14 tablet 0  . Prenatal Vit-Fe Fumarate-FA (PRENATAL MULTIVITAMIN) TABS tablet Take 1 tablet by mouth daily at 12 noon.     No current facility-administered medications on file prior to visit.     Objective:     Vitals:   06/07/17 0926  BP: 106/68  Pulse: 78                Assessment:    G1P1001 Patient Active Problem List   Diagnosis Date Noted  . URI (upper respiratory infection) 03/13/2017  . Labor and delivery indication for care or intervention 01/06/2017  . Urinary urgency 01/03/2017  . Gastroesophageal reflux in pregnancy  10/03/2016  . Pregnancy 09/21/2016  . Skin cyst 09/21/2016  . Right upper quadrant pain 09/21/2016  . PCO (polycystic ovaries) 10/18/2015     1. Encounter for initial prescription of contraceptive  pills     We have reviewed the multiple reasons for choosing OCPs for birth control including endometriosis and the possibility of PCO.  We have discussed future fertility and use of OCPs.   Plan:            1.  OCPs The risks /benefits of OCPs have been explained to the patient in detail.  Product literature has been given to her.  I have instructed her in the use of OCPs and have given her literature reinforcing this information.  I have explained to the patient that OCPs are not as effective for birth control during the first month of use, and that another form of contraception should be used during this time.  Both first-day start and Sunday start have been explained.  The risks and benefits of each was discussed.  She has been made aware of  the fact that other medications may affect the efficacy of OCPs.  I have answered all of her questions, and I believe that she has an understanding of the effectiveness and use of OCPs.  Orders Orders Placed This Encounter  Procedures  . POCT urine pregnancy     Meds ordered this encounter  Medications  . desogestrel-ethinyl estradiol (MIRCETTE) 0.15-0.02/0.01 MG (21/5) tablet    Sig: Take 1 tablet by mouth at bedtime.    Dispense:  1 Package    Refill:  2      F/U  Return in about 3 months (around 09/07/2017). I spent 18 minutes with this patient of which greater than 50% was spent discussing PCR, endometriosis, use of OCPs for suppression future fertility.  All patient's questions were answered.  Elonda Husky, M.D. 06/07/2017 10:43 AM

## 2017-06-15 ENCOUNTER — Other Ambulatory Visit: Payer: Self-pay

## 2017-06-15 ENCOUNTER — Ambulatory Visit (INDEPENDENT_AMBULATORY_CARE_PROVIDER_SITE_OTHER): Payer: BLUE CROSS/BLUE SHIELD | Admitting: Family Medicine

## 2017-06-15 ENCOUNTER — Encounter: Payer: Self-pay | Admitting: Family Medicine

## 2017-06-15 VITALS — BP 100/60 | HR 63 | Temp 98.2°F | Resp 14 | Ht 59.0 in | Wt 128.0 lb

## 2017-06-15 DIAGNOSIS — J0120 Acute ethmoidal sinusitis, unspecified: Secondary | ICD-10-CM

## 2017-06-15 MED ORDER — AMOXICILLIN-POT CLAVULANATE 875-125 MG PO TABS: 1.0000 | ORAL_TABLET | Freq: Two times a day (BID) | ORAL | 0 refills | Status: DC

## 2017-06-15 NOTE — Progress Notes (Signed)
  Subjective:     Patient ID: Darlene Ellis, female   DOB: [birth year], 32 y.o.   MRN: [id number]  HPI Acute visit to Saturday clinic. Patient has history of intermittent nasal congestion along with some inteittent nasal discharge. She's had some pressure over her frontal and ethmoid sinuses. Thick yellow nasal discharge.  Symptoms have waxed and waned over a month. Rare cough. No fevers or chills  Past Medical History:  Diagnosis Date  . Endometrial polyp   . UTI (lower urinary tract infection)   . Yeast infection    Past Surgical History:  Procedure Laterality Date  . DILATION AND CURETTAGE OF UTERUS      reports that she has never smoked. She has never used smokeless tobacco. She reports that she does not drink alcohol or use drugs. family history includes Colon cancer in her maternal aunt; Diabetes in her paternal grandmother; Hypertension in her father; Ovarian cancer in her maternal aunt. No Known Allergies   Review of Systems  Constitutional: Negative for chills and fever.  HENT: Positive for congestion, sinus pressure and sinus pain.   Respiratory: Negative for shortness of breath.   Skin: Negative for rash.  Neurological: Positive for headaches.       Objective:   Physical Exam  Constitutional: She appears well-developed and well-nourished.  HENT:  Right Ear: External ear normal.  Left Ear: External ear normal.  Thick yellow mucus in both nares. Mucosa slightly erythema otherwise normal.  Neck: Neck supple.  Cardiovascular: Normal rate and regular rhythm.  Pulmonary/Chest: Effort normal. No respiratory distress. She has no wheezes. She has no rales.  Lymphadenopathy:    She has no cervical adenopathy.       Assessment:     Acute sinusitis    Plan:     -given duration of symptoms start Augmentin 875 mg daily for 10 days -Consider saline nasal irrigation -Follow-up as needed  Kristian Covey MD [facility]

## 2017-06-15 NOTE — Patient Instructions (Signed)

## 2017-07-25 ENCOUNTER — Ambulatory Visit (INDEPENDENT_AMBULATORY_CARE_PROVIDER_SITE_OTHER): Payer: BLUE CROSS/BLUE SHIELD | Admitting: Family Medicine

## 2017-07-25 ENCOUNTER — Encounter: Payer: Self-pay | Admitting: Family Medicine

## 2017-07-25 VITALS — BP 100/64 | HR 83 | Temp 98.4°F | Resp 16 | Wt 128.1 lb

## 2017-07-25 DIAGNOSIS — J309 Allergic rhinitis, unspecified: Secondary | ICD-10-CM

## 2017-07-25 MED ORDER — FLUTICASONE PROPIONATE 50 MCG/ACT NA SUSP: 2.0000 | Freq: Every day | NASAL | 6 refills | Status: DC

## 2017-07-25 NOTE — Patient Instructions (Signed)
It was a pleasure meeting you today!  Please use over the counter nasal saline gel for dry nasal passages.  Flonase nasal spray is for symptoms of nasal congestion and allergies.  If symptoms do not improve with treatment, worsen, or new symptoms develop, please follow up with PCP for further evaluation and treatment.  Fluticasone nasal spray What is this medicine? FLUTICASONE (floo TIK a sone) is a corticosteroid. This medicine is used to treat the symptoms of allergies like sneezing, itchy red eyes, and itchy, runny, or stuffy nose. This medicine is also used to treat nasal polyps. This medicine may be used for other purposes; ask your health care provider or pharmacist if you have questions. COMMON BRAND NAME(S): Flonase, Flonase Allergy Relief, Flonase Sensimist, Veramyst, XHANCE What should I tell my health care provider before I take this medicine? They need to know if you have any of these conditions: -cataracts -glaucoma -infection, like tuberculosis, herpes, or fungal infection -recent surgery on nose or sinuses -taking a corticosteroid by mouth -an unusual or allergic reaction to fluticasone, steroids, other medicines, foods, dyes, or preservatives -pregnant or trying to get pregnant -breast-feeding How should I use this medicine? This medicine is for use in the nose. Follow the directions on your product or prescription label. This medicine works best if used at regular intervals. Do not use more often than directed. Make sure that you are using your nasal spray correctly. After 6 months of daily use for allergies, talk to your doctor or health care professional before using it for a longer time. Ask your doctor or health care professional if you have any questions. Talk to your pediatrician regarding the use of this medicine in children. Special care may be needed. Some products have been used for allergies in children as young as 2 years. After 2 months of daily use without a  prescription in a child, talk to your pediatrician before using it for a longer time. Use of this medicine for nasal polyps is not approved in children. Overdosage: If you think you have taken too much of this medicine contact a poison control center or emergency room at once. NOTE: This medicine is only for you. Do not share this medicine with others. What if I miss a dose? If you miss a dose, use it as soon as you remember. If it is almost time for your next dose, use only that dose and continue with your regular schedule. Do not use double or extra doses. What may interact with this medicine? -certain antibiotics like clarithromycin and telithromycin -certain medicines for fungal infections like ketoconazole, itraconazole, and voriconazole -conivaptan -nefazodone -some medicines for HIV -vaccines This list may not describe all possible interactions. Give your health care provider a list of all the medicines, herbs, non-prescription drugs, or dietary supplements you use. Also tell them if you smoke, drink alcohol, or use illegal drugs. Some items may interact with your medicine. What should I watch for while using this medicine? Visit your doctor or health care professional for regular checks on your progress. Some symptoms may improve within 12 hours after starting use. Check with your doctor or health care professional if there is no improvement in your symptoms after 3 weeks of use. This medicine may increase your risk of getting an infection. Tell your doctor or health care professional if you are around anyone with measles or chickenpox, or if you develop sores or blisters that do not heal properly. What side effects may I notice from  receiving this medicine? Side effects that you should report to your doctor or health care professional as soon as possible: -allergic reactions like skin rash, itching or hives, swelling of the face, lips, or tongue -changes in vision -crusting or sores in  the nose -nosebleed -signs and symptoms of infection like fever or chills; cough; sore throat -white patches or sores in the mouth or nose Side effects that usually do not require medical attention (report to your doctor or health care professional if they continue or are bothersome): -burning or irritation inside the nose or throat -cough -headache -unusual taste or smell This list may not describe all possible side effects. Call your doctor for medical advice about side effects. You may report side effects to FDA at 1-800-FDA-1088. Where should I keep my medicine? Keep out of the reach of children. Store at room temperature between 15 and 30 degrees C (59 and 86 degrees F). Avoid exposure to extreme heat, cold, or light. Throw away any unused medicine after the expiration date. NOTE: This sheet is a summary. It may not cover all possible information. If you have questions about this medicine, talk to your doctor, pharmacist, or health care provider.  2018 Elsevier/Gold Standard (2015-10-28 14:23:12)

## 2017-07-25 NOTE — Progress Notes (Signed)
Subjective:    Patient ID: Darlene Ellis, female    DOB: 1985/05/30, 32 y.o.   MRN: [id number]  HPI  Darlene Ellis is a 32 year old female who presents today for evaluation of nasal soreness and congestion. She reports having "scabs" in her nose when she wakes up in the mornings. Symptoms have been present for 3 months. Associated symptom of rhinitis in the morning and night.  She was treated for sinusitis on 06/15/17 and these symptoms improved however mild nasal congestion with dryness have remained present. She has noticed dryness with increased use of air conditioning.  Treatment with saline nasal spray one time provided minimal benefit.  She denies fever, chills, sweats, ear pain, sore throat, cough, SOB, or N/V. She reports feeling well otherwise.   Review of Systems  Constitutional: Negative for chills, fatigue and fever.  HENT: Positive for congestion and rhinorrhea. Negative for ear pain, postnasal drip, sinus pressure, sinus pain, sneezing and sore throat.        Nasal soresness  Eyes: Negative for itching.  Respiratory: Negative for cough and shortness of breath.   Cardiovascular: Negative for chest pain.  Gastrointestinal: Negative for abdominal pain.  Skin: Negative for rash.   Past Medical History:  Diagnosis Date  . Endometrial polyp   . UTI (lower urinary tract infection)   . Yeast infection      Social History   Socioeconomic History  . Marital status: Married    Spouse name: Not on file  . Number of children: Not on file  . Years of education: Not on file  . Highest education level: Not on file  Occupational History  . Not on file  Social Needs  . Financial resource strain: Not on file  . Food insecurity:    Worry: Not on file    Inability: Not on file  . Transportation needs:    Medical: Not on file    Non-medical: Not on file  Tobacco Use  . Smoking status: Former Smoker    Years: 11.00  . Smokeless tobacco: Never Used  Substance and Sexual Activity    . Alcohol use: No    Comment: occas  . Drug use: No  . Sexual activity: Yes    Birth control/protection: None, Condom  Lifestyle  . Physical activity:    Days per week: Not on file    Minutes per session: Not on file  . Stress: Not on file  Relationships  . Social connections:    Talks on phone: Not on file    Gets together: Not on file    Attends religious service: Not on file    Active member of club or organization: Not on file    Attends meetings of clubs or organizations: Not on file    Relationship status: Not on file  . Intimate partner violence:    Fear of current or ex partner: Not on file    Emotionally abused: Not on file    Physically abused: Not on file    Forced sexual activity: Not on file  Other Topics Concern  . Not on file  Social History Narrative  . Not on file    Past Surgical History:  Procedure Laterality Date  . DILATION AND CURETTAGE OF UTERUS      Family History  Problem Relation Age of Onset  . Hypertension Father   . Ovarian cancer Maternal Aunt   . Colon cancer Maternal Aunt   . Diabetes Paternal Grandmother   .  Breast cancer Neg Hx     No Known Allergies  Current Outpatient Medications on File Prior to Visit  Medication Sig Dispense Refill  . amoxicillin-clavulanate (AUGMENTIN) 875-125 MG tablet Take 1 tablet by mouth 2 (two) times daily. 20 tablet 0  . desogestrel-ethinyl estradiol (MIRCETTE) 0.15-0.02/0.01 MG (21/5) tablet Take 1 tablet by mouth at bedtime. 1 Package 2  . Multiple Vitamin (MULTIVITAMIN) tablet Take 1 tablet by mouth daily.    . Probiotic Product (PROBIOTIC-10 PO) Take by mouth.     No current facility-administered medications on file prior to visit.     BP 100/64 (BP Location: Left Arm, Patient Position: Sitting, Cuff Size: Normal)   Pulse 83   Temp 98.4 F (36.9 C) (Oral)   Resp 16   Wt 128 lb 2 oz (58.1 kg)   SpO2 (!) 9%   BMI 25.88 kg/m       Objective:   Physical Exam  Constitutional: She  appears well-developed and well-nourished.  HENT:  Right Ear: Tympanic membrane normal.  Left Ear: Tympanic membrane normal.  Nose: Right sinus exhibits no maxillary sinus tenderness and no frontal sinus tenderness. Left sinus exhibits no maxillary sinus tenderness and no frontal sinus tenderness.  Mouth/Throat: Oropharynx is clear and moist and mucous membranes are normal.  Mildly erythematous nares; minimal clear/pale yellow drainage present; no evidence of lesions in nares.  Eyes: Pupils are equal, round, and reactive to light. No scleral icterus.  Neck: Neck supple.  Cardiovascular: Normal rate and normal heart sounds.  Pulmonary/Chest: Effort normal and breath sounds normal.  Lymphadenopathy:    She has no cervical adenopathy.  Skin: Skin is warm and dry. Capillary refill takes less than 2 seconds.  Psychiatric: She has a normal mood and affect. Her behavior is normal. Judgment and thought content normal.       Assessment & Plan:  1. Allergic rhinitis, unspecified seasonality, unspecified trigger Symptoms of allergic rhinitis present with nasal dryness that may be exacerbated by air conditioning. VSS and she reports feeling well otherwise stating that she feels better after treatment for sinusitis on 06/15/17. We discussed options of using saline nasal gel consistently for dryness and also a trial of flonase. Reviewed use of flonase and discussed that dryness can be a concern with use of this medication. She opted for saline gel with flonase and will monitor symptoms for improvement. She stated that symptoms are not to the point of considering an oral medication at this time.   - fluticasone (FLONASE) 50 MCG/ACT nasal spray; Place 2 sprays into both nostrils daily.  Dispense: 16 g; Refill: 6  Follow up if no improvement.  Roddie McJulia Earon Rivest, FNP-C

## 2017-07-26 DIAGNOSIS — M419 Scoliosis, unspecified: Secondary | ICD-10-CM | POA: Diagnosis not present

## 2017-08-02 ENCOUNTER — Ambulatory Visit
Admission: RE | Admit: 2017-08-02 | Discharge: 2017-08-02 | Disposition: A | Discharge status: Home / Self Care | Payer: BLUE CROSS/BLUE SHIELD | Source: Ambulatory Visit | Attending: Student | Admitting: Student

## 2017-08-02 ENCOUNTER — Other Ambulatory Visit: Payer: Self-pay | Admitting: Student

## 2017-08-02 DIAGNOSIS — M4185 Other forms of scoliosis, thoracolumbar region: Secondary | ICD-10-CM | POA: Diagnosis not present

## 2017-08-02 DIAGNOSIS — M419 Scoliosis, unspecified: Secondary | ICD-10-CM | POA: Diagnosis not present

## 2017-08-25 ENCOUNTER — Other Ambulatory Visit: Payer: Self-pay | Admitting: Obstetrics and Gynecology

## 2017-08-25 DIAGNOSIS — Z30011 Encounter for initial prescription of contraceptive pills: Secondary | ICD-10-CM

## 2017-09-06 ENCOUNTER — Encounter: Payer: Self-pay | Admitting: Obstetrics and Gynecology

## 2017-09-06 ENCOUNTER — Ambulatory Visit (INDEPENDENT_AMBULATORY_CARE_PROVIDER_SITE_OTHER): Payer: BLUE CROSS/BLUE SHIELD | Admitting: Obstetrics and Gynecology

## 2017-09-06 ENCOUNTER — Other Ambulatory Visit (HOSPITAL_COMMUNITY)
Admission: RE | Admit: 2017-09-06 | Discharge: 2017-09-06 | Disposition: A | Discharge status: Home / Self Care | Payer: BLUE CROSS/BLUE SHIELD | Source: Ambulatory Visit | Attending: Obstetrics and Gynecology | Admitting: Obstetrics and Gynecology

## 2017-09-06 VITALS — BP 104/66 | Ht 59.0 in | Wt 123.0 lb

## 2017-09-06 DIAGNOSIS — Z0000 Encounter for general adult medical examination without abnormal findings: Secondary | ICD-10-CM | POA: Insufficient documentation

## 2017-09-06 LAB — POCT URINALYSIS DIPSTICK
Bilirubin, UA: NEGATIVE
Blood, UA: NEGATIVE
Glucose, UA: NEGATIVE
Ketones, UA: NEGATIVE
Leukocytes, UA: NEGATIVE
Nitrite, UA: NEGATIVE
Protein, UA: NEGATIVE
Spec Grav, UA: 1.01 (ref 1.010–1.025)
Urobilinogen, UA: 0.2 E.U./dL
pH, UA: 6 (ref 5.0–8.0)

## 2017-09-06 MED ORDER — DESOGESTREL-ETHINYL ESTRADIOL 0.15-0.02/0.01 MG (21/5) PO TABS: 1.0000 | ORAL_TABLET | Freq: Every day | ORAL | 3 refills | Status: DC

## 2017-09-06 NOTE — Progress Notes (Signed)
Pt wants to talk about different birth control options

## 2017-09-06 NOTE — Progress Notes (Signed)
HPI:      [name] is a 32 y.o. G1P1001 who LMP was Patient's last menstrual period was 09/01/2017 (exact date).  Subjective:   She presents today for her annual examination.  She is taking OCPs as directed.  She had some questions regarding the timing of her menstrual period and OCPs.  After further review we have decided that her menstrual.  Is arriving appropriately and lasting appropriately.  She would like to stay on OCPs.    Hx: The following portions of the patient's history were reviewed and updated as appropriate:             She  has a past medical history of Endometrial polyp, UTI (lower urinary tract infection), and Yeast infection. She does not have any pertinent problems on file. She  has a past surgical history that includes Dilation and curettage of uterus. Her family history includes Colon cancer in her maternal aunt; Diabetes in her paternal grandmother; Hypertension in her father; Ovarian cancer in her maternal aunt. She  reports that she has quit smoking. She quit after 11.00 years of use. She has never used smokeless tobacco. She reports that she does not drink alcohol or use drugs. She has a current medication list which includes the following prescription(s): fluticasone, multivitamin, probiotic product, viorele, and amoxicillin-clavulanate. She has No Known Allergies.       Review of Systems:  Review of Systems  Constitutional: Denied constitutional symptoms, night sweats, recent illness, fatigue, fever, insomnia and weight loss.  Eyes: Denied eye symptoms, eye pain, photophobia, vision change and visual disturbance.  Ears/Nose/Throat/Neck: Denied ear, nose, throat or neck symptoms, hearing loss, nasal discharge, sinus congestion and sore throat.  Cardiovascular: Denied cardiovascular symptoms, arrhythmia, chest pain/pressure, edema, exercise intolerance, orthopnea and palpitations.  Respiratory: Denied pulmonary symptoms, asthma, pleuritic pain, productive  sputum, cough, dyspnea and wheezing.  Gastrointestinal: Denied, gastro-esophageal reflux, melena, nausea and vomiting.  Genitourinary: Denied genitourinary symptoms including symptomatic vaginal discharge, pelvic relaxation issues, and urinary complaints.  Musculoskeletal: Denied musculoskeletal symptoms, stiffness, swelling, muscle weakness and myalgia.  Dermatologic: Denied dermatology symptoms, rash and scar.  Neurologic: Denied neurology symptoms, dizziness, headache, neck pain and syncope.  Psychiatric: Denied psychiatric symptoms, anxiety and depression.  Endocrine: Denied endocrine symptoms including hot flashes and night sweats.   Meds:   Current Outpatient Medications on File Prior to Visit  Medication Sig Dispense Refill  . fluticasone (FLONASE) 50 MCG/ACT nasal spray Place 2 sprays into both nostrils daily. 16 g 6  . Multiple Vitamin (MULTIVITAMIN) tablet Take 1 tablet by mouth daily.    . Probiotic Product (PROBIOTIC-10 PO) Take by mouth.    Marland Kitchen VIORELE 0.15-0.02/0.01 MG (21/5) tablet TAKE 1 TABLET BY MOUTH AT BEDTIME 28 tablet 0  . amoxicillin-clavulanate (AUGMENTIN) 875-125 MG tablet Take 1 tablet by mouth 2 (two) times daily. (Patient not taking: Reported on 09/06/2017) 20 tablet 0   No current facility-administered medications on file prior to visit.     Objective:     Vitals:   09/06/17 0808  BP: 104/66              Physical examination General NAD, Conversant  HEENT Atraumatic; Op clear with mmm.  Normo-cephalic. Pupils reactive. Anicteric sclerae  Thyroid/Neck Smooth without nodularity or enlargement. Normal ROM.  Neck Supple.  Skin No rashes, lesions or ulceration. Normal palpated skin turgor. No nodularity.  Breasts: No masses or discharge.  Symmetric.  No axillary adenopathy.  Lungs: Clear to auscultation.No rales or wheezes.  Normal Respiratory effort, no retractions.  Heart: NSR.  No murmurs or rubs appreciated. No periferal edema  Abdomen: Soft.  Non-tender.   No masses.  No HSM. No hernia  Extremities: Moves all appropriately.  Normal ROM for age. No lymphadenopathy.  Neuro: Oriented to PPT.  Normal mood. Normal affect.     Pelvic:   Vulva: Normal appearance.  No lesions.  Vagina: No lesions or abnormalities noted.  Support: Normal pelvic support.  Urethra No masses tenderness or scarring.  Meatus Normal size without lesions or prolapse.  Cervix: Normal appearance.  No lesions.  Anus: Normal exam.  No lesions.  Perineum: Normal exam.  No lesions.        Bimanual   Uterus: Normal size.  Non-tender.  Mobile.  AV.  Adnexae: No masses.  Non-tender to palpation.  Cul-de-sac: Negative for abnormality.      Assessment:    G1P1001 Patient Active Problem List   Diagnosis Date Noted  . URI (upper respiratory infection) 03/13/2017  . Labor and delivery indication for care or intervention 01/06/2017  . Urinary urgency 01/03/2017  . Gastroesophageal reflux in pregnancy 10/03/2016  . Pregnancy 09/21/2016  . Skin cyst 09/21/2016  . Right upper quadrant pain 09/21/2016  . PCO (polycystic ovaries) 10/18/2015     1. Annual physical exam     Patient taking OCPs without problem.   Plan:            1.  Basic Screening Recommendations The basic screening recommendations for asymptomatic women were discussed with the patient during her visit.  The age-appropriate recommendations were discussed with her and the rational for the tests reviewed.  When I am informed by the patient that another primary care physician has previously obtained the age-appropriate tests and they are up-to-date, only outstanding tests are ordered and referrals given as necessary.  Abnormal results of tests will be discussed with her when all of her results are completed. Pap performed Orders Orders Placed This Encounter  Procedures  . POCT urinalysis dipstick    No orders of the defined types were placed in this encounter.       F/U  Return in about 1 year (around  09/07/2018) for Annual Physical.  Elonda Huskyavid J. Gladine Plude, M.D. 09/06/2017 8:52 AM

## 2017-09-10 LAB — CYTOLOGY - PAP
Diagnosis: NEGATIVE
HPV 16/18/45 genotyping: NEGATIVE
HPV: DETECTED — AB

## 2017-09-16 ENCOUNTER — Telehealth: Payer: Self-pay | Admitting: Obstetrics and Gynecology

## 2017-09-16 NOTE — Telephone Encounter (Signed)
The patient has questions about her recent test results for the HPV.  She is requesting the nurse or provider call her.  Please advise, thanks.

## 2017-09-16 NOTE — Telephone Encounter (Signed)
Advised pt to make and appt to discuss her results with Dr. Logan BoresEvans.

## 2017-10-04 ENCOUNTER — Ambulatory Visit (INDEPENDENT_AMBULATORY_CARE_PROVIDER_SITE_OTHER): Payer: BLUE CROSS/BLUE SHIELD | Admitting: Obstetrics and Gynecology

## 2017-10-04 ENCOUNTER — Encounter: Payer: Self-pay | Admitting: Obstetrics and Gynecology

## 2017-10-04 VITALS — BP 93/59 | HR 69 | Ht 59.0 in | Wt 123.0 lb

## 2017-10-04 DIAGNOSIS — N72 Inflammatory disease of cervix uteri: Secondary | ICD-10-CM | POA: Diagnosis not present

## 2017-10-04 DIAGNOSIS — B977 Papillomavirus as the cause of diseases classified elsewhere: Secondary | ICD-10-CM | POA: Diagnosis not present

## 2017-10-04 NOTE — Progress Notes (Signed)
HPI:      [name] is a 32 y.o. G1P1001 who LMP was Patient's last menstrual period was 09/26/2017 (exact date).  Subjective:   She presents today after saying that she had HPV on her Pap smear in my chart.  She wanted to talk about this abnormality and find out if there is anything that she needs to do.    Hx: The following portions of the patient's history were reviewed and updated as appropriate:             She  has a past medical history of Endometrial polyp, UTI (lower urinary tract infection), and Yeast infection. She does not have any pertinent problems on file. She  has a past surgical history that includes Dilation and curettage of uterus. Her family history includes Colon cancer in her maternal aunt; Diabetes in her paternal grandmother; Hypertension in her father; Ovarian cancer in her maternal aunt. She  reports that she has quit smoking. She quit after 11.00 years of use. She has never used smokeless tobacco. She reports that she does not drink alcohol or use drugs. She has a current medication list which includes the following prescription(s): desogestrel-ethinyl estradiol, fluticasone, multivitamin, probiotic product, viorele, and amoxicillin-clavulanate. She has No Known Allergies.       Review of Systems:  Review of Systems  Constitutional: Denied constitutional symptoms, night sweats, recent illness, fatigue, fever, insomnia and weight loss.  Eyes: Denied eye symptoms, eye pain, photophobia, vision change and visual disturbance.  Ears/Nose/Throat/Neck: Denied ear, nose, throat or neck symptoms, hearing loss, nasal discharge, sinus congestion and sore throat.  Cardiovascular: Denied cardiovascular symptoms, arrhythmia, chest pain/pressure, edema, exercise intolerance, orthopnea and palpitations.  Respiratory: Denied pulmonary symptoms, asthma, pleuritic pain, productive sputum, cough, dyspnea and wheezing.  Gastrointestinal: Denied, gastro-esophageal reflux, melena,  nausea and vomiting.  Genitourinary: Denied genitourinary symptoms including symptomatic vaginal discharge, pelvic relaxation issues, and urinary complaints.  Musculoskeletal: Denied musculoskeletal symptoms, stiffness, swelling, muscle weakness and myalgia.  Dermatologic: Denied dermatology symptoms, rash and scar.  Neurologic: Denied neurology symptoms, dizziness, headache, neck pain and syncope.  Psychiatric: Denied psychiatric symptoms, anxiety and depression.  Endocrine: Denied endocrine symptoms including hot flashes and night sweats.   Meds:   Current Outpatient Medications on File Prior to Visit  Medication Sig Dispense Refill  . desogestrel-ethinyl estradiol (MIRCETTE) 0.15-0.02/0.01 MG (21/5) tablet Take 1 tablet by mouth at bedtime. 3 Package 3  . fluticasone (FLONASE) 50 MCG/ACT nasal spray Place 2 sprays into both nostrils daily. 16 g 6  . Multiple Vitamin (MULTIVITAMIN) tablet Take 1 tablet by mouth daily.    . Probiotic Product (PROBIOTIC-10 PO) Take by mouth.    Marland Kitchen VIORELE 0.15-0.02/0.01 MG (21/5) tablet TAKE 1 TABLET BY MOUTH AT BEDTIME 28 tablet 0  . amoxicillin-clavulanate (AUGMENTIN) 875-125 MG tablet Take 1 tablet by mouth 2 (two) times daily. (Patient not taking: Reported on 10/04/2017) 20 tablet 0   No current facility-administered medications on file prior to visit.     Objective:     Vitals:   10/04/17 0820  BP: (!) 93/59  Pulse: 69              Pap smear positive for high risk HPV.  Assessment:    G1P1001 Patient Active Problem List   Diagnosis Date Noted  . URI (upper respiratory infection) 03/13/2017  . Labor and delivery indication for care or intervention 01/06/2017  . Urinary urgency 01/03/2017  . Gastroesophageal reflux in pregnancy 10/03/2016  . Pregnancy  09/21/2016  . Skin cyst 09/21/2016  . Right upper quadrant pain 09/21/2016  . PCO (polycystic ovaries) 10/18/2015     1. High risk human papilloma virus (HPV) infection of cervix      Patient had Gardasil vaccination so her HPV is not 16 or 18.  Normal cytology   Plan:            1.  We have discussed the natural course and history of abnormal Pap smears in detail.  The possible progression and regression discussed in detail.  Relationship between cytology and human papilloma virus was discussed in detail.  Possible follow-up scenarios reviewed.  Acquiring the HPV was discussed in detail including the sexually transmitted nature.  All her questions were answered.  Her husband also had questions regarding his HPV status and possible treatment options.  His questions were also answered.    Orders No orders of the defined types were placed in this encounter.   No orders of the defined types were placed in this encounter.     F/U  Return for Annual Physical. I spent 27 minutes involved in the care of this patient of which greater than 50% was spent discussing HPV, cytology and abnormal Pap smears.  Please see above discussion.  All patient's questions answered.  Darlene Ellis, M.D. 10/04/2017 1:45 PM

## 2017-12-03 IMAGING — US US AXILLARY RIGHT
1 series · 14 of 16 positions shown · non-contrast
Comparison: None.

CLINICAL DATA: Enlarging right axillary skin nodule, present for
the past 3-4 years.

EXAM:
ULTRASOUND OF THE RIGHT AXILLA

[Series 1: us axillary right · 0.07mm/px · 14 of 16 slices shown]
[im 1/16]
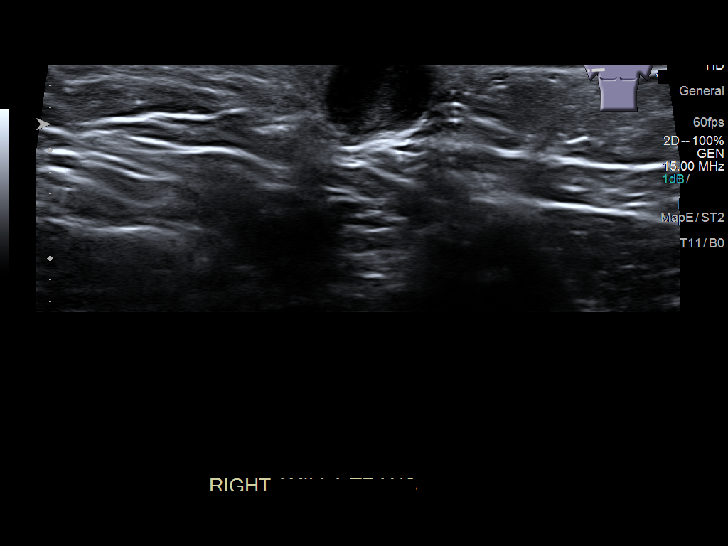
[im 2/16]
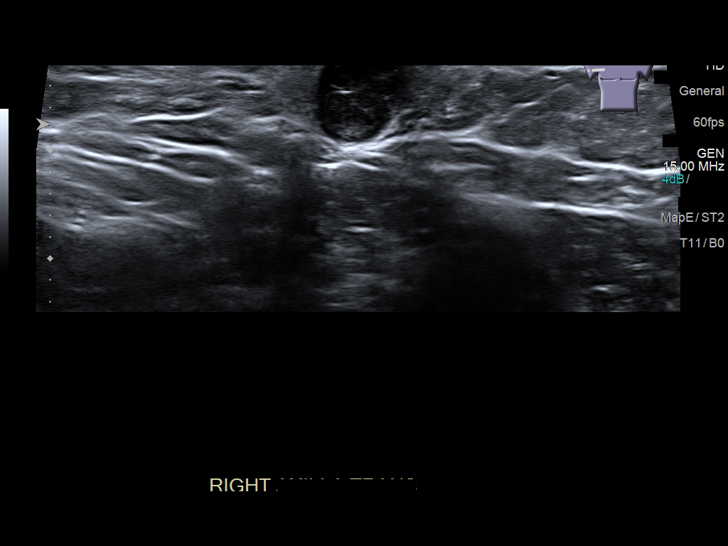
[im 3/16]
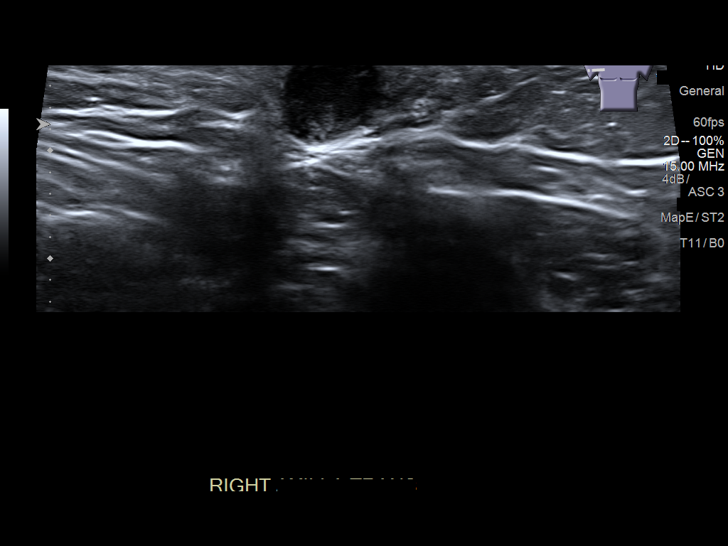
[im 5/16]
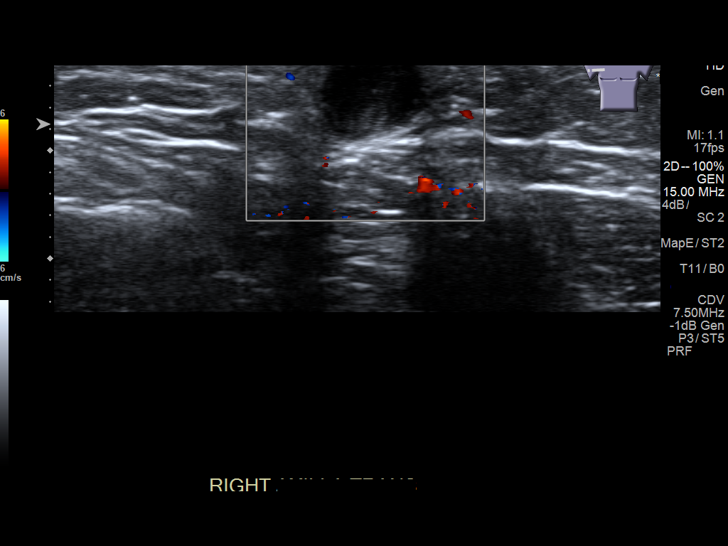
[im 6/16]
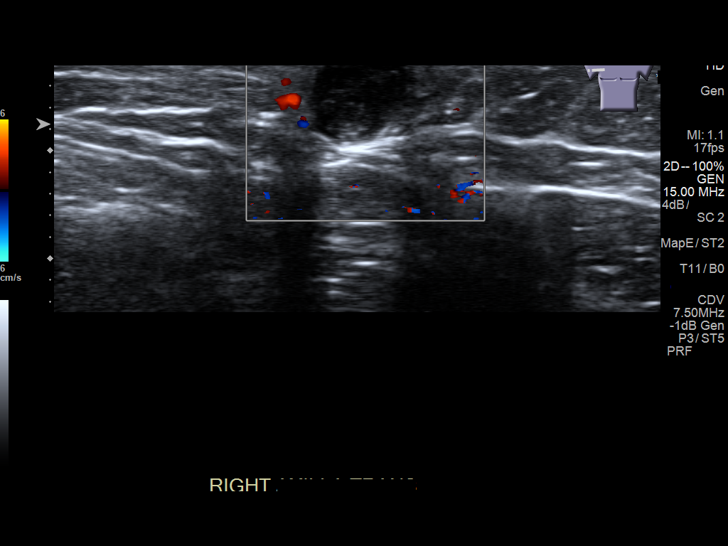
[im 7/16]
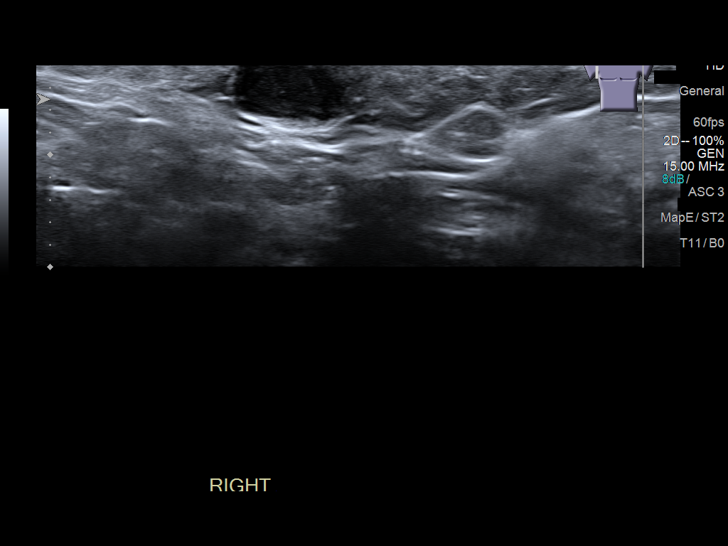
[im 8/16]
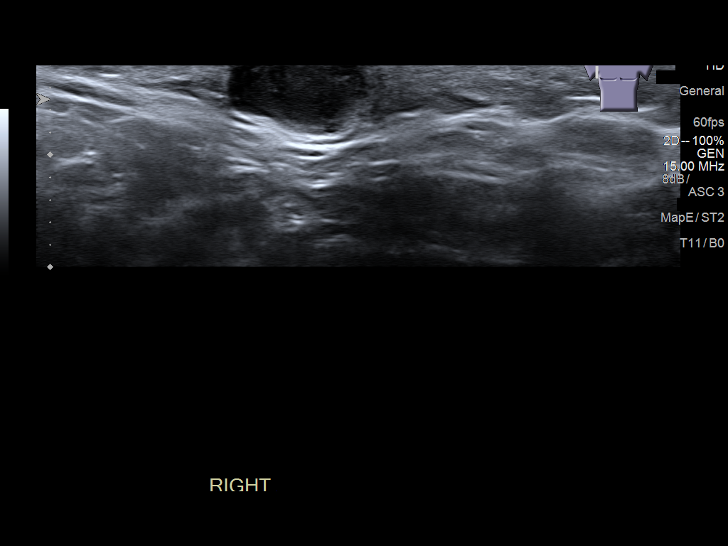
[im 9/16]
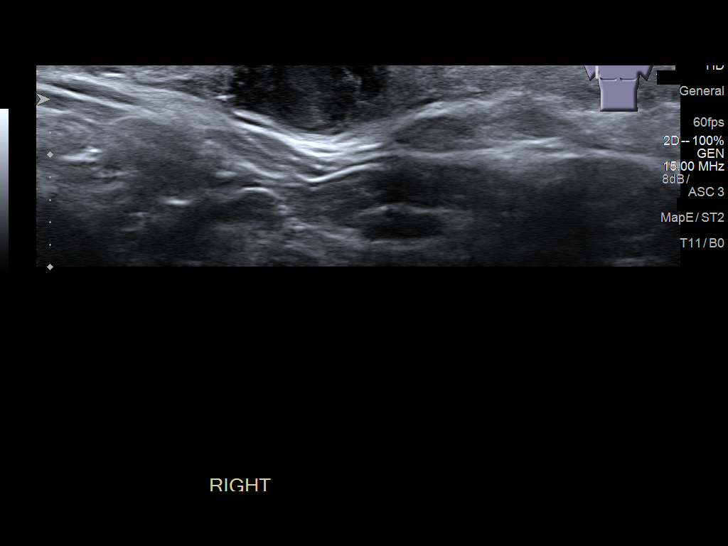
[im 10/16]
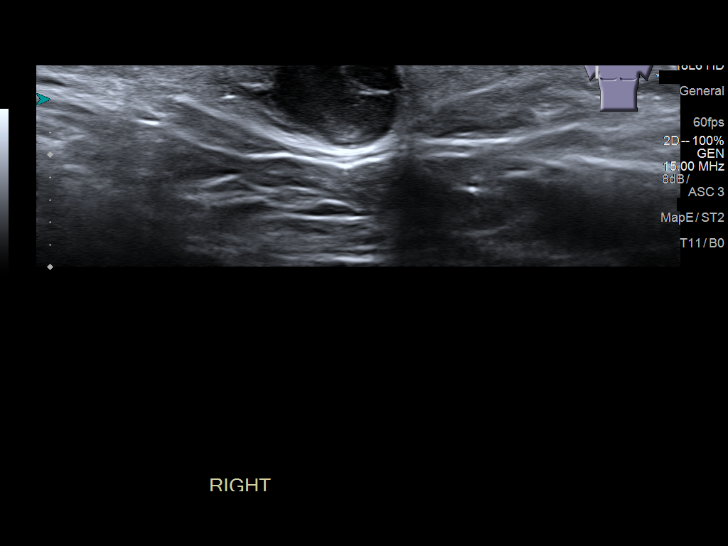
[im 11/16]
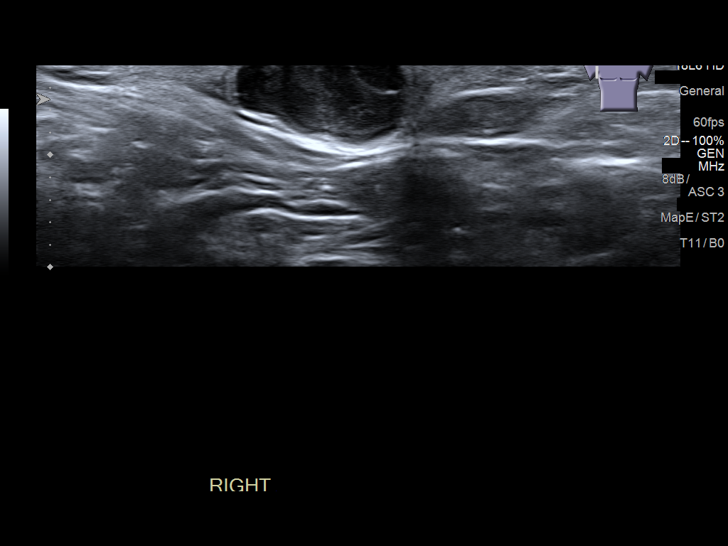
[im 13/16]
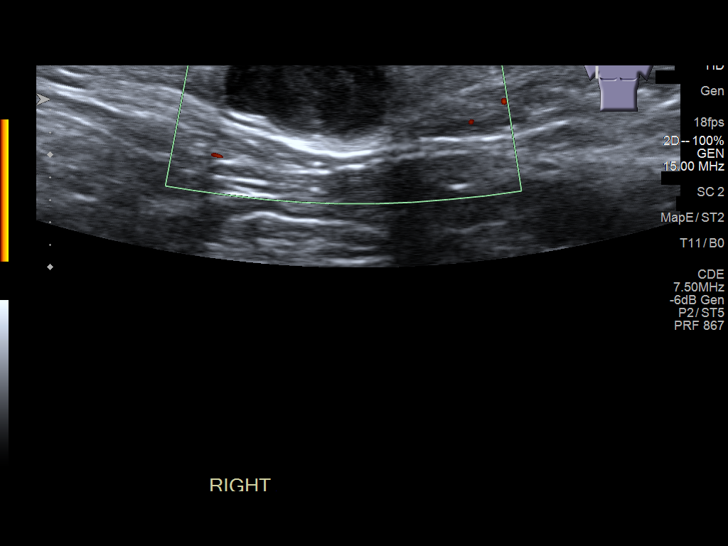
[im 14/16]
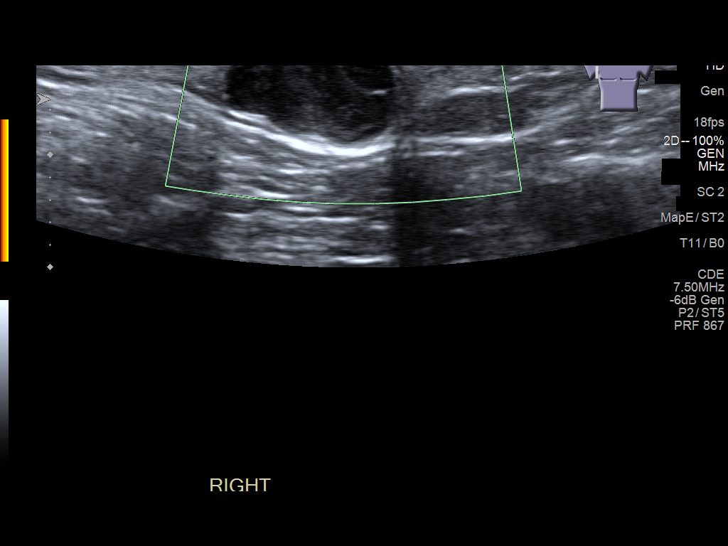
[im 15/16]
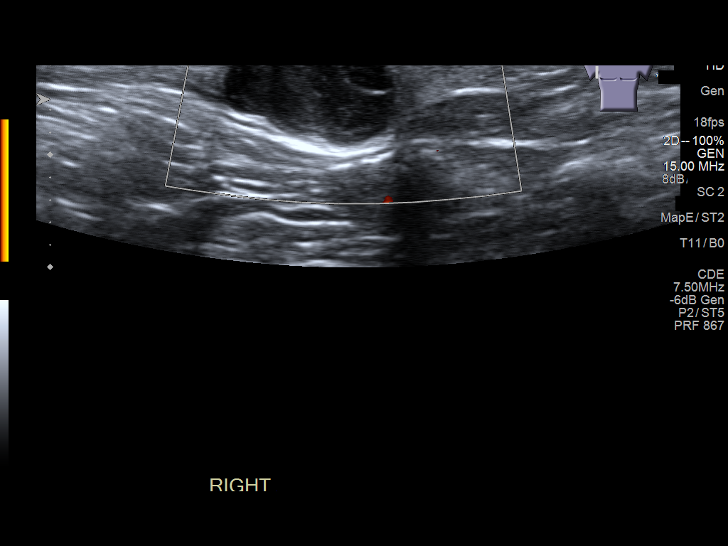
[im 16/16]
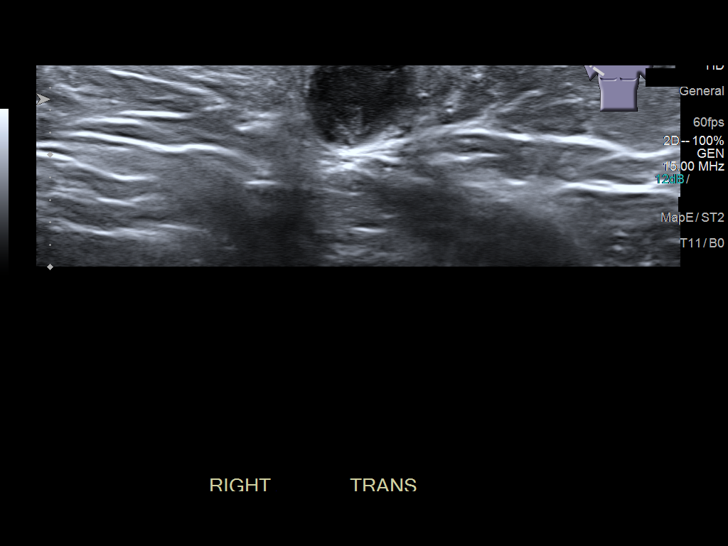

[14 of 16 positions shown; findings below may reference images not displayed]

FINDINGS: Ultrasound is performed, showing a 1.6 x 1.0 x 0.9 cm oval,
horizontally oriented, hypoechoic, circumscribed mass in the skin of
the right axilla. No internal blood flow was seen with color
Doppler. This exhibits increased through transmission of sound. No
abnormal appearing lymph nodes are demonstrated.
IMPRESSION: 1.6 cm right axillary sebaceous cyst or epidermal inclusion cyst.

RECOMMENDATION:
Clinical followup.

BI-RADS CATEGORY  2: Benign.

## 2018-06-05 ENCOUNTER — Other Ambulatory Visit: Payer: Self-pay

## 2018-06-05 DIAGNOSIS — Z30011 Encounter for initial prescription of contraceptive pills: Secondary | ICD-10-CM

## 2018-06-05 DIAGNOSIS — Z0000 Encounter for general adult medical examination without abnormal findings: Secondary | ICD-10-CM

## 2018-06-05 MED ORDER — DESOGESTREL-ETHINYL ESTRADIOL 0.15-0.02/0.01 MG (21/5) PO TABS: 1.0000 | ORAL_TABLET | Freq: Every day | ORAL | 0 refills | Status: DC

## 2018-06-05 NOTE — Telephone Encounter (Signed)
Prescription sent to pharmacy on file and MyChart message sent to make patient aware.

## 2018-08-25 ENCOUNTER — Other Ambulatory Visit: Payer: Self-pay | Admitting: Obstetrics and Gynecology

## 2018-08-25 DIAGNOSIS — Z0000 Encounter for general adult medical examination without abnormal findings: Secondary | ICD-10-CM

## 2018-09-04 ENCOUNTER — Other Ambulatory Visit: Payer: Self-pay

## 2018-09-08 ENCOUNTER — Ambulatory Visit: Payer: BLUE CROSS/BLUE SHIELD | Admitting: Family Medicine

## 2018-09-12 ENCOUNTER — Other Ambulatory Visit: Payer: BC Managed Care – PPO

## 2018-09-12 ENCOUNTER — Other Ambulatory Visit: Payer: Self-pay

## 2018-09-12 ENCOUNTER — Ambulatory Visit (INDEPENDENT_AMBULATORY_CARE_PROVIDER_SITE_OTHER): Payer: BC Managed Care – PPO | Admitting: Obstetrics and Gynecology

## 2018-09-12 ENCOUNTER — Other Ambulatory Visit (HOSPITAL_COMMUNITY)
Admission: RE | Admit: 2018-09-12 | Discharge: 2018-09-12 | Disposition: A | Discharge status: Home / Self Care | Payer: BC Managed Care – PPO | Source: Ambulatory Visit | Attending: Obstetrics and Gynecology | Admitting: Obstetrics and Gynecology

## 2018-09-12 ENCOUNTER — Encounter: Payer: Self-pay | Admitting: Obstetrics and Gynecology

## 2018-09-12 VITALS — BP 111/73 | HR 74 | Wt 126.1 lb

## 2018-09-12 DIAGNOSIS — Z30011 Encounter for initial prescription of contraceptive pills: Secondary | ICD-10-CM

## 2018-09-12 DIAGNOSIS — Z01419 Encounter for gynecological examination (general) (routine) without abnormal findings: Secondary | ICD-10-CM | POA: Diagnosis not present

## 2018-09-12 DIAGNOSIS — Z124 Encounter for screening for malignant neoplasm of cervix: Secondary | ICD-10-CM | POA: Diagnosis not present

## 2018-09-12 MED ORDER — DESOGESTREL-ETHINYL ESTRADIOL 0.15-0.02/0.01 MG (21/5) PO TABS: 1.0000 | ORAL_TABLET | Freq: Every day | ORAL | 3 refills | Status: DC

## 2018-09-12 NOTE — Progress Notes (Signed)
Patient comes in today annual exam and repeat pap.

## 2018-09-12 NOTE — Progress Notes (Signed)
HPI:      [name] is a 33 y.o. G1P1001 who LMP was No LMP recorded.  Subjective:   She presents today for her annual examination.  She is taking OCPs as directed having monthly periods. She does complain of occasional urine loss with coughing laughing sneezing. Complains of her right breast occasionally " tearing".  This heals on its own and is not currently present. Patient considering near future pregnancy and had questions regarding pregnancy and COVID. Patient is also concerned that she may have diabetes.  She had a fasting blood sugar of 106 while at home [place]  She would like to be tested.    Hx: The following portions of the patient's history were reviewed and updated as appropriate:             She  has a past medical history of Endometrial polyp, UTI (lower urinary tract infection), and Yeast infection. She does not have any pertinent problems on file. She  has a past surgical history that includes Dilation and curettage of uterus. Her family history includes Colon cancer in her maternal aunt; Diabetes in her paternal grandmother; Hypertension in her father; Ovarian cancer in her maternal aunt. She  reports that she has quit smoking. She quit after 11.00 years of use. She has never used smokeless tobacco. She reports that she does not drink alcohol or use drugs. She has a current medication list which includes the following prescription(s): multivitamin, probiotic product, viorele, and viorele. She has No Known Allergies.       Review of Systems:  Review of Systems  Constitutional: Denied constitutional symptoms, night sweats, recent illness, fatigue, fever, insomnia and weight loss.  Eyes: Denied eye symptoms, eye pain, photophobia, vision change and visual disturbance.  Ears/Nose/Throat/Neck: Denied ear, nose, throat or neck symptoms, hearing loss, nasal discharge, sinus congestion and sore throat.  Cardiovascular: Denied cardiovascular symptoms,  arrhythmia, chest pain/pressure, edema, exercise intolerance, orthopnea and palpitations.  Respiratory: Denied pulmonary symptoms, asthma, pleuritic pain, productive sputum, cough, dyspnea and wheezing.  Gastrointestinal: Denied, gastro-esophageal reflux, melena, nausea and vomiting.  Genitourinary: See HPI for additional information.  Musculoskeletal: Denied musculoskeletal symptoms, stiffness, swelling, muscle weakness and myalgia.  Dermatologic: Denied dermatology symptoms, rash and scar.  Neurologic: Denied neurology symptoms, dizziness, headache, neck pain and syncope.  Psychiatric: Denied psychiatric symptoms, anxiety and depression.  Endocrine: Denied endocrine symptoms including hot flashes and night sweats.   Meds:   Current Outpatient Medications on File Prior to Visit  Medication Sig Dispense Refill  . Multiple Vitamin (MULTIVITAMIN) tablet Take 1 tablet by mouth daily.    . Probiotic Product (PROBIOTIC-10 PO) Take by mouth.    Marland Kitchen. VIORELE 0.15-0.02/0.01 MG (21/5) tablet TAKE 1 TABLET BY MOUTH AT BEDTIME 28 tablet 0  . VIORELE 0.15-0.02/0.01 MG (21/5) tablet TAKE 1 TABLET BY MOUTH AT BEDTIME 84 tablet 1   No current facility-administered medications on file prior to visit.     Objective:     Vitals:   09/12/18 1205  BP: 111/73  Pulse: 74              Physical examination General NAD, Conversant  HEENT Atraumatic; Op clear with mmm.  Normo-cephalic. Pupils reactive. Anicteric sclerae  Thyroid/Neck Smooth without nodularity or enlargement. Normal ROM.  Neck Supple.  Skin No rashes, lesions or ulceration. Normal palpated skin turgor. No nodularity.  Breasts: No masses or discharge.  Symmetric.  No axillary adenopathy.  Lungs: Clear to auscultation.No rales or wheezes.  Normal Respiratory effort, no retractions.  Heart: NSR.  No murmurs or rubs appreciated. No periferal edema  Abdomen: Soft.  Non-tender.  No masses.  No HSM. No hernia  Extremities: Moves all appropriately.   Normal ROM for age. No lymphadenopathy.  Neuro: Oriented to PPT.  Normal mood. Normal affect.     Pelvic:   Vulva: Normal appearance.  No lesions.  Vagina: No lesions or abnormalities noted.  Support:  Second-degree cystocele  Urethra No masses tenderness or scarring.  Meatus Normal size without lesions or prolapse.  Cervix: Normal appearance.  No lesions.  Anus: Normal exam.  No lesions.  Perineum: Normal exam.  No lesions.        Bimanual   Uterus: Normal size.  Non-tender.  Mobile.  AV.  Adnexae: No masses.  Non-tender to palpation.  Cul-de-sac: Negative for abnormality.      Assessment:    G1P1001 Patient Active Problem List   Diagnosis Date Noted  . URI (upper respiratory infection) 03/13/2017  . Labor and delivery indication for care or intervention 01/06/2017  . Urinary urgency 01/03/2017  . Gastroesophageal reflux in pregnancy 10/03/2016  . Pregnancy 09/21/2016  . Skin cyst 09/21/2016  . Right upper quadrant pain 09/21/2016  . PCO (polycystic ovaries) 10/18/2015     1. Well woman exam with routine gynecological exam   2. Screening for cervical cancer        Plan:            1.  Basic Screening Recommendations The basic screening recommendations for asymptomatic women were discussed with the patient during her visit.  The age-appropriate recommendations were discussed with her and the rational for the tests reviewed.  When I am informed by the patient that another primary care physician has previously obtained the age-appropriate tests and they are up-to-date, only outstanding tests are ordered and referrals given as necessary.  Abnormal results of tests will be discussed with her when all of her results are completed. Pap performed because of the patient's history of positive HPV. (She was previously vaccinated) she has had normal cytology in the past 2.  I could find no breast abnormality consistent with her "tearing".  Patient has been asked to present if  this happens again. 3.  Timing of intercourse and discontinuation of OCPs discussed in the event patient decides upon pregnancy.  Use of prenatal vitamins prior to conception discussed. 4.  Hemoglobin A1c ordered 5.  Continue OCPs at this time Orders Orders Placed This Encounter  Procedures  . Hemoglobin A1c    No orders of the defined types were placed in this encounter.       F/U  Return in about 1 year (around 09/12/2019) for Annual Physical.  Finis Bud, M.D. 09/12/2018 12:51 PM

## 2018-09-13 LAB — HEMOGLOBIN A1C
Est. average glucose Bld gHb Est-mCnc: 103 mg/dL
Hgb A1c MFr Bld: 5.2 % (ref 4.8–5.6)

## 2018-09-16 LAB — CYTOLOGY - PAP: Diagnosis: NEGATIVE

## 2018-12-20 ENCOUNTER — Other Ambulatory Visit: Payer: Self-pay

## 2018-12-20 DIAGNOSIS — Z20822 Contact with and (suspected) exposure to covid-19: Secondary | ICD-10-CM

## 2018-12-22 LAB — NOVEL CORONAVIRUS, NAA: SARS-CoV-2, NAA: NOT DETECTED

## 2019-05-28 ENCOUNTER — Ambulatory Visit (INDEPENDENT_AMBULATORY_CARE_PROVIDER_SITE_OTHER): Payer: BC Managed Care – PPO | Admitting: Obstetrics and Gynecology

## 2019-05-28 ENCOUNTER — Encounter: Payer: Self-pay | Admitting: Obstetrics and Gynecology

## 2019-05-28 ENCOUNTER — Other Ambulatory Visit: Payer: Self-pay

## 2019-05-28 VITALS — BP 137/82 | HR 86 | Ht 59.0 in | Wt 126.6 lb

## 2019-05-28 DIAGNOSIS — R0789 Other chest pain: Secondary | ICD-10-CM

## 2019-05-28 DIAGNOSIS — N644 Mastodynia: Secondary | ICD-10-CM | POA: Diagnosis not present

## 2019-05-28 DIAGNOSIS — F410 Panic disorder [episodic paroxysmal anxiety] without agoraphobia: Secondary | ICD-10-CM

## 2019-05-28 DIAGNOSIS — F411 Generalized anxiety disorder: Secondary | ICD-10-CM

## 2019-05-28 NOTE — Progress Notes (Signed)
HPI:      [name] is a 34 y.o. G1P1001 who LMP was Patient's last menstrual period was 04/30/2019.  Subjective:   She presents today with 2 major issues.  The first is that she complains of left breast tenderness especially when recently using an air pump.  She states that it has been present for about 4 weeks.  Not there all the time seems to come and go.  Denies nipple discharge denies trauma to the breast. Of significant note one of the patient's friends was recently diagnosed with stage IV breast cancer.  Darlene Ellis has a very heightened sense of anxiety regarding breast cancer. Her second issue is that she states yesterday she became very anxious, her heart beat fast, she felt" area on her back", her knees got weak, she had difficulty breathing, lightheadedness etc.  This resolved a short time later.  She says that since that time she has not felt quite like herself and she is afraid it might happen again.    Hx: The following portions of the patient's history were reviewed and updated as appropriate:             She  has a past medical history of Endometrial polyp, UTI (lower urinary tract infection), and Yeast infection. She does not have any pertinent problems on file. She  has a past surgical history that includes Dilation and curettage of uterus. Her family history includes Colon cancer in her maternal aunt; Diabetes in her paternal grandmother; Hypertension in her father; Ovarian cancer in her maternal aunt. She  reports that she has quit smoking. She quit after 11.00 years of use. She has never used smokeless tobacco. She reports that she does not drink alcohol or use drugs. She has a current medication list which includes the following prescription(s): desogestrel-ethinyl estradiol, multivitamin, and probiotic product. She has No Known Allergies.       Review of Systems:  Review of Systems  Constitutional: Denied constitutional symptoms, night sweats, recent illness, fatigue,  fever, insomnia and weight loss.  Eyes: Denied eye symptoms, eye pain, photophobia, vision change and visual disturbance.  Ears/Nose/Throat/Neck: Denied ear, nose, throat or neck symptoms, hearing loss, nasal discharge, sinus congestion and sore throat.  Cardiovascular: Denied cardiovascular symptoms, arrhythmia, chest pain/pressure, edema, exercise intolerance, orthopnea and palpitations.  Respiratory: Denied pulmonary symptoms, asthma, pleuritic pain, productive sputum, cough, dyspnea and wheezing.  Gastrointestinal: Denied, gastro-esophageal reflux, melena, nausea and vomiting.  Genitourinary: See HPI for additional information.  Musculoskeletal: Denied musculoskeletal symptoms, stiffness, swelling, muscle weakness and myalgia.  Dermatologic: Denied dermatology symptoms, rash and scar.  Neurologic: Denied neurology symptoms, dizziness, headache, neck pain and syncope.  Psychiatric: See HPI for additional information.  Endocrine: Denied endocrine symptoms including hot flashes and night sweats.   Meds:   Current Outpatient Medications on File Prior to Visit  Medication Sig Dispense Refill  . desogestrel-ethinyl estradiol (VIORELE) 0.15-0.02/0.01 MG (21/5) tablet Take 1 tablet by mouth at bedtime. 3 Package 3  . Multiple Vitamin (MULTIVITAMIN) tablet Take 1 tablet by mouth daily.    . Probiotic Product (PROBIOTIC-10 PO) Take by mouth.     No current facility-administered medications on file prior to visit.    Objective:     Vitals:   05/28/19 0947  BP: 137/82  Pulse: 86              Bilateral breast examination reveals no masses, no axillary adenopathy, no nipple discharge.   When asked to identify the area of  soreness in her left breast she first pointed to the upper inner quadrant then lower outer quadrant then lower inner quadrant and finally ended by saying it seems like it is everywhere. Palpation of the pectoralis muscle on the left side reproduce the pain the patient was  describing.  No breast tissue was involved during this. Palpation of the right pectoralis muscle in a similar manner also resulted in discomfort.  Assessment:    G1P1001 Patient Active Problem List   Diagnosis Date Noted  . URI (upper respiratory infection) 03/13/2017  . Labor and delivery indication for care or intervention 01/06/2017  . Urinary urgency 01/03/2017  . Gastroesophageal reflux in pregnancy 10/03/2016  . Pregnancy 09/21/2016  . Skin cyst 09/21/2016  . Right upper quadrant pain 09/21/2016  . PCO (polycystic ovaries) 10/18/2015     1. Breast pain   2. Left-sided chest wall pain   3. Generalized anxiety disorder with panic attacks     I believe that what she is describing as breast pain is most consistent with pectoralis muscle soreness.  Possibly from exertion.  Absolutely no evidence of breast mass discharge or abnormality.   Patient has significant anxiety possibly because her friend has a diagnosis of breast cancer.  She also states there is another situation which she did not elaborate about that is causing her anxiety.  It is most likely that she has had a panic attack and this explains her multiple system symptoms as noted in the history of present illness.   Plan:            1.  Reassured patient regarding breast tenderness.  Discussed use of NSAID S.  If pain remains patient to inform us and we will consider follow-up mammogram or ultrasound.  2.  Discussed panic attacks and heightened anxiety level.  We discussed this in some detail and decreasing her anxiety and taking herself out of situations that increase her anxiety have been discussed.  If symptoms continue possible future use of Holter monitor discussed. Orders No orders of the defined types were placed in this encounter.   No orders of the defined types were placed in this encounter.     F/U  No follow-ups on file. I spent 33 minutes involved in the care of this patient preparing to see the patient  by obtaining and reviewing her medical history (including labs, imaging tests and prior procedures), documenting clinical information in the electronic health record (EHR), counseling and coordinating care plans, writing and sending prescriptions, ordering tests or procedures and directly communicating with the patient by discussing pertinent items from her history and physical exam as well as detailing my assessment and plan as noted above so that she has an informed understanding.  All of her questions were answered.  Elonda Husky, M.D. 05/28/2019 10:35 AM

## 2019-09-15 ENCOUNTER — Other Ambulatory Visit: Payer: Self-pay

## 2019-09-15 ENCOUNTER — Ambulatory Visit (INDEPENDENT_AMBULATORY_CARE_PROVIDER_SITE_OTHER): Payer: BC Managed Care – PPO | Admitting: Obstetrics and Gynecology

## 2019-09-15 ENCOUNTER — Encounter: Payer: Self-pay | Admitting: Obstetrics and Gynecology

## 2019-09-15 VITALS — BP 100/64 | HR 76 | Ht 59.0 in | Wt 128.5 lb

## 2019-09-15 DIAGNOSIS — Z01419 Encounter for gynecological examination (general) (routine) without abnormal findings: Secondary | ICD-10-CM

## 2019-09-15 NOTE — Progress Notes (Signed)
HPI:      [name] is a 34 y.o. G1P1001 who LMP was Patient's last menstrual period was 09/03/2019.  Subjective:   She presents today for her annual examination.  She presents today for her annual examination.  She was taking OCPs but she stopped them last month because she would like to become pregnant.  She has a history of PCO and that is why she is taking OCPs.  She is currently having intercourse every other day or every day.  She became pregnant within a month of trying with her last baby. She plans to start prenatal vitamins today.    Hx: The following portions of the patient's history were reviewed and updated as appropriate:             She  has a past medical history of Endometrial polyp, UTI (lower urinary tract infection), and Yeast infection. She does not have any pertinent problems on file. She  has a past surgical history that includes Dilation and curettage of uterus. Her family history includes Colon cancer in her maternal aunt; Diabetes in her paternal grandmother; Hypertension in her father; Ovarian cancer in her maternal aunt. She  reports that she has quit smoking. She quit after 11.00 years of use. She has never used smokeless tobacco. She reports that she does not drink alcohol and does not use drugs. She has a current medication list which includes the following prescription(s): desogestrel-ethinyl estradiol and multivitamin. She has No Known Allergies.       Review of Systems:  Review of Systems  Constitutional: Denied constitutional symptoms, night sweats, recent illness, fatigue, fever, insomnia and weight loss.  Eyes: Denied eye symptoms, eye pain, photophobia, vision change and visual disturbance.  Ears/Nose/Throat/Neck: Denied ear, nose, throat or neck symptoms, hearing loss, nasal discharge, sinus congestion and sore throat.  Cardiovascular: Denied cardiovascular symptoms, arrhythmia, chest pain/pressure, edema, exercise intolerance, orthopnea and  palpitations.  Respiratory: Denied pulmonary symptoms, asthma, pleuritic pain, productive sputum, cough, dyspnea and wheezing.  Gastrointestinal: Denied, gastro-esophageal reflux, melena, nausea and vomiting.  Genitourinary: Denied genitourinary symptoms including symptomatic vaginal discharge, pelvic relaxation issues, and urinary complaints.  Musculoskeletal: Denied musculoskeletal symptoms, stiffness, swelling, muscle weakness and myalgia.  Dermatologic: Denied dermatology symptoms, rash and scar.  Neurologic: Denied neurology symptoms, dizziness, headache, neck pain and syncope.  Psychiatric: Denied psychiatric symptoms, anxiety and depression.  Endocrine: Denied endocrine symptoms including hot flashes and night sweats.   Meds:   Current Outpatient Medications on File Prior to Visit  Medication Sig Dispense Refill  . desogestrel-ethinyl estradiol (VIORELE) 0.15-0.02/0.01 MG (21/5) tablet Take 1 tablet by mouth at bedtime. 3 Package 3  . Multiple Vitamin (MULTIVITAMIN) tablet Take 1 tablet by mouth daily. (Patient not taking: Reported on 09/15/2019)     No current facility-administered medications on file prior to visit.    Objective:     Vitals:   09/15/19 1100  BP: 100/64  Pulse: 76              Physical examination General NAD, Conversant  HEENT Atraumatic; Op clear with mmm.  Normo-cephalic. Pupils reactive. Anicteric sclerae  Thyroid/Neck Smooth without nodularity or enlargement. Normal ROM.  Neck Supple.  Skin No rashes, lesions or ulceration. Normal palpated skin turgor. No nodularity.  Breasts: No masses or discharge.  Symmetric.  No axillary adenopathy.  Lungs: Clear to auscultation.No rales or wheezes. Normal Respiratory effort, no retractions.  Heart: NSR.  No murmurs or rubs appreciated. No periferal edema  Abdomen: Soft.  Non-tender.  No masses.  No HSM. No hernia  Extremities: Moves all appropriately.  Normal ROM for age. No lymphadenopathy.  Neuro: Oriented to  PPT.  Normal mood. Normal affect.     Pelvic:   Vulva: Normal appearance.  No lesions.  Vagina: No lesions or abnormalities noted.  Support: Normal pelvic support.  Urethra No masses tenderness or scarring.  Meatus Normal size without lesions or prolapse.  Cervix: Normal appearance.  No lesions.  Anus: Normal exam.  No lesions.  Perineum: Normal exam.  No lesions.        Bimanual   Uterus: Normal size.  Non-tender.  Mobile.  AV.  Adnexae: No masses.  Non-tender to palpation.  Cul-de-sac: Negative for abnormality.      Assessment:    G1P1001 Patient Active Problem List   Diagnosis Date Noted  . URI (upper respiratory infection) 03/13/2017  . Labor and delivery indication for care or intervention 01/06/2017  . Urinary urgency 01/03/2017  . Gastroesophageal reflux in pregnancy 10/03/2016  . Pregnancy 09/21/2016  . Skin cyst 09/21/2016  . Right upper quadrant pain 09/21/2016  . PCO (polycystic ovaries) 10/18/2015     1. Well woman exam with routine gynecological exam     Patient attempting pregnancy at this time.   Plan:            1.  As long as patient cycles every month there is very likely she is ovulating.  Continue intercourse every day or every other day as recommended.  Ovulation predictor kits discussed in detail patient will consider.  Prenatal vitamins advised.  Covid vaccination discussed in detail and advised. Patient to follow-up when she has a positive pregnancy test or when she starts skipping menstrual periods without a positive test. Orders No orders of the defined types were placed in this encounter.   No orders of the defined types were placed in this encounter.           F/U  Return for Annual Physical.  Elonda Husky, M.D. 09/15/2019 11:23 AM

## 2019-10-06 ENCOUNTER — Encounter: Payer: BC Managed Care – PPO | Admitting: Obstetrics and Gynecology

## 2019-10-07 ENCOUNTER — Other Ambulatory Visit: Payer: Self-pay

## 2019-10-07 ENCOUNTER — Encounter: Payer: Self-pay | Admitting: Obstetrics and Gynecology

## 2019-10-07 ENCOUNTER — Ambulatory Visit (INDEPENDENT_AMBULATORY_CARE_PROVIDER_SITE_OTHER): Payer: BC Managed Care – PPO | Admitting: Obstetrics and Gynecology

## 2019-10-07 VITALS — BP 97/60 | HR 91 | Ht 59.0 in | Wt 130.3 lb

## 2019-10-07 DIAGNOSIS — N926 Irregular menstruation, unspecified: Secondary | ICD-10-CM

## 2019-10-07 LAB — POCT URINE PREGNANCY: Preg Test, Ur: POSITIVE — AB

## 2019-10-07 NOTE — Progress Notes (Signed)
HPI:      [name] is a 34 y.o. G2P1001 who LMP was Patient's last menstrual period was 09/03/2019 (exact date).  Subjective:   She presents today with a positive pregnancy test and a missed menstrual period.  She just began attempting pregnancy in the last few weeks.  She is taking prenatal vitamins.  She states that she has some pelvic pressure symptoms but no bleeding or other issues.  She is excited about being pregnant.    Hx: The following portions of the patient's history were reviewed and updated as appropriate:             She  has a past medical history of Endometrial polyp, UTI (lower urinary tract infection), and Yeast infection. She does not have any pertinent problems on file. She  has a past surgical history that includes Dilation and curettage of uterus. Her family history includes Colon cancer in her maternal aunt; Diabetes in her paternal grandmother; Hypertension in her father; Ovarian cancer in her maternal aunt. She  reports that she has quit smoking. She quit after 11.00 years of use. She has never used smokeless tobacco. She reports that she does not drink alcohol and does not use drugs. She has a current medication list which includes the following prescription(s): multivitamin and desogestrel-ethinyl estradiol. She has No Known Allergies.       Review of Systems:  Review of Systems  Constitutional: Denied constitutional symptoms, night sweats, recent illness, fatigue, fever, insomnia and weight loss.  Eyes: Denied eye symptoms, eye pain, photophobia, vision change and visual disturbance.  Ears/Nose/Throat/Neck: Denied ear, nose, throat or neck symptoms, hearing loss, nasal discharge, sinus congestion and sore throat.  Cardiovascular: Denied cardiovascular symptoms, arrhythmia, chest pain/pressure, edema, exercise intolerance, orthopnea and palpitations.  Respiratory: Denied pulmonary symptoms, asthma, pleuritic pain, productive sputum, cough, dyspnea and  wheezing.  Gastrointestinal: Denied, gastro-esophageal reflux, melena, nausea and vomiting.  Genitourinary: See HPI for additional information.  Musculoskeletal: Denied musculoskeletal symptoms, stiffness, swelling, muscle weakness and myalgia.  Dermatologic: Denied dermatology symptoms, rash and scar.  Neurologic: Denied neurology symptoms, dizziness, headache, neck pain and syncope.  Psychiatric: Denied psychiatric symptoms, anxiety and depression.  Endocrine: Denied endocrine symptoms including hot flashes and night sweats.   Meds:   Current Outpatient Medications on File Prior to Visit  Medication Sig Dispense Refill  . Multiple Vitamin (MULTIVITAMIN) tablet Take 1 tablet by mouth daily.     Marland Kitchen desogestrel-ethinyl estradiol (VIORELE) 0.15-0.02/0.01 MG (21/5) tablet Take 1 tablet by mouth at bedtime. 3 Package 3   No current facility-administered medications on file prior to visit.    Objective:     Vitals:   10/07/19 0954  BP: 97/60  Pulse: 91              Urinary pregnancy test positive  Assessment:    G2P1001 Patient Active Problem List   Diagnosis Date Noted  . URI (upper respiratory infection) 03/13/2017  . Labor and delivery indication for care or intervention 01/06/2017  . Urinary urgency 01/03/2017  . Gastroesophageal reflux in pregnancy 10/03/2016  . Pregnancy 09/21/2016  . Skin cyst 09/21/2016  . Right upper quadrant pain 09/21/2016  . PCO (polycystic ovaries) 10/18/2015     1. Missed menses     Positive pregnancy test patient approximately 4 weeks estimated gestational age.   Plan:            Prenatal Plan 1.  The patient was given prenatal literature. 2.  She was continued  on prenatal vitamins. 3.  A prenatal lab panel was ordered or drawn. 4.  An ultrasound was ordered to better determine an EDC. 5.  A nurse visit was scheduled. 6.  Genetic testing and testing for other inheritable conditions discussed in detail. She will decide in the future  whether to have these labs performed. 7.  A general overview of pregnancy testing, visit schedule, ultrasound schedule, and prenatal care was discussed. 8.  COVID and its risks associated with pregnancy, prevention by limiting exposure and use of masks, as well as the risks and benefits of vaccination during pregnancy were discussed in detail.  [facility] policy regarding office and hospital visitation and testing was explained. 9.  Benefits of breast-feeding discussed in detail including both maternal and infant benefits. Ready Set Baby website discussed.   Orders Orders Placed This Encounter  Procedures  . US OB Comp Less 14 Wks  . POCT urine pregnancy    No orders of the defined types were placed in this encounter.     F/U  Return in about 8 weeks (around 12/02/2019). I spent 22 minutes involved in the care of this patient preparing to see the patient by obtaining and reviewing her medical history (including labs, imaging tests and prior procedures), documenting clinical information in the electronic health record (EHR), counseling and coordinating care plans, writing and sending prescriptions, ordering tests or procedures and directly communicating with the patient by discussing pertinent items from her history and physical exam as well as detailing my assessment and plan as noted above so that she has an informed understanding.  All of her questions were answered.  Elonda Husky, M.D. 10/07/2019 10:25 AM

## 2019-10-20 ENCOUNTER — Other Ambulatory Visit: Payer: Self-pay

## 2019-10-20 ENCOUNTER — Ambulatory Visit (INDEPENDENT_AMBULATORY_CARE_PROVIDER_SITE_OTHER): Payer: BC Managed Care – PPO

## 2019-10-20 ENCOUNTER — Telehealth: Payer: Self-pay

## 2019-10-20 DIAGNOSIS — Z3A01 Less than 8 weeks gestation of pregnancy: Secondary | ICD-10-CM

## 2019-10-20 DIAGNOSIS — N926 Irregular menstruation, unspecified: Secondary | ICD-10-CM | POA: Diagnosis not present

## 2019-10-20 MED ORDER — DOXYLAMINE-PYRIDOXINE 10-10 MG PO TBEC: 2.0000 | DELAYED_RELEASE_TABLET | Freq: Every day | ORAL | 0 refills | Status: DC

## 2019-10-20 NOTE — Telephone Encounter (Signed)
diclegis script sent to preferred pharmacy.

## 2019-10-28 ENCOUNTER — Other Ambulatory Visit: Payer: Self-pay | Admitting: Obstetrics and Gynecology

## 2019-10-28 DIAGNOSIS — Z30011 Encounter for initial prescription of contraceptive pills: Secondary | ICD-10-CM

## 2019-10-28 NOTE — Progress Notes (Signed)
Darlene Ellis presents for NOB nurse interview visit. Pregnancy confirmation done 10/07/19 Dr Logan Bores. LMP 09/03/19. G 2 P- 1 0 0 1. Dating ultrasound 10/20/19 6 w 6 d. EDD 06/08/20. Pregnancy education material explained and given.0  cats in the home. NOB labs ordered. HIV labs and Drug screen were explained and she declined. PNV encouraged. Genetic screening declined.  Pt may discuss with provider. Pt. To follow up with provider in _4_ weeks for NOB physical.  All questions answered. FMLA paper explained and signed. Financial policy reviewed and understood.

## 2019-10-28 NOTE — Telephone Encounter (Signed)
Patient is pregnant.

## 2019-10-30 ENCOUNTER — Other Ambulatory Visit: Payer: Self-pay

## 2019-10-30 ENCOUNTER — Ambulatory Visit: Payer: Self-pay

## 2019-10-30 ENCOUNTER — Telehealth: Payer: Self-pay

## 2019-10-30 VITALS — BP 122/77 | HR 83 | Wt 129.4 lb

## 2019-10-30 DIAGNOSIS — Z3A08 8 weeks gestation of pregnancy: Secondary | ICD-10-CM

## 2019-10-30 NOTE — Telephone Encounter (Signed)
Pt was checking out and stated that she wanted to make sure that we are using the [facility] in [place] on [address]. I told her I would send a message to the nurse.

## 2019-10-31 LAB — MICROSCOPIC EXAMINATION
Bacteria, UA: NONE SEEN
Casts: NONE SEEN /lpf
RBC: NONE SEEN /hpf (ref 0–2)

## 2019-10-31 LAB — CBC WITH DIFFERENTIAL
Basophils Absolute: 0 10*3/uL (ref 0.0–0.2)
Basos: 0 %
EOS (ABSOLUTE): 0.1 10*3/uL (ref 0.0–0.4)
Eos: 1 %
Hematocrit: 41.4 % (ref 34.0–46.6)
Hemoglobin: 13.5 g/dL (ref 11.1–15.9)
Immature Grans (Abs): 0 10*3/uL (ref 0.0–0.1)
Immature Granulocytes: 0 %
Lymphocytes Absolute: 1.8 10*3/uL (ref 0.7–3.1)
Lymphs: 22 %
MCH: 29.5 pg (ref 26.6–33.0)
MCHC: 32.6 g/dL (ref 31.5–35.7)
MCV: 91 fL (ref 79–97)
Monocytes Absolute: 0.6 10*3/uL (ref 0.1–0.9)
Monocytes: 7 %
Neutrophils Absolute: 5.9 10*3/uL (ref 1.4–7.0)
Neutrophils: 70 %
RBC: 4.57 x10E6/uL (ref 3.77–5.28)
RDW: 13 % (ref 11.7–15.4)
WBC: 8.4 10*3/uL (ref 3.4–10.8)

## 2019-10-31 LAB — ANTIBODY SCREEN: Antibody Screen: NEGATIVE

## 2019-10-31 LAB — URINALYSIS, ROUTINE W REFLEX MICROSCOPIC
Bilirubin, UA: NEGATIVE
Glucose, UA: NEGATIVE
Nitrite, UA: NEGATIVE
Protein,UA: NEGATIVE
RBC, UA: NEGATIVE
Specific Gravity, UA: 1.009 (ref 1.005–1.030)
Urobilinogen, Ur: 0.2 mg/dL (ref 0.2–1.0)
pH, UA: 6.5 (ref 5.0–7.5)

## 2019-10-31 LAB — ABO AND RH: Rh Factor: POSITIVE

## 2019-10-31 LAB — RPR: RPR Ser Ql: NONREACTIVE

## 2019-10-31 LAB — RUBELLA SCREEN: Rubella Antibodies, IGG: 6.72 index (ref >0.99)

## 2019-10-31 LAB — VARICELLA ZOSTER ANTIBODY, IGG: Varicella zoster IgG: 2264 index (ref >165)

## 2019-10-31 LAB — HEPATITIS B SURFACE ANTIGEN: Hepatitis B Surface Ag: NEGATIVE

## 2019-11-03 LAB — URINE CULTURE

## 2019-11-03 LAB — GC/CHLAMYDIA PROBE AMP
Chlamydia trachomatis, NAA: NEGATIVE
Neisseria Gonorrhoeae by PCR: NEGATIVE

## 2019-11-08 IMAGING — CR DG SCOLIOSIS EVAL COMPLETE SPINE 2-3V
1 series · 1 of 1 positions shown · non-contrast
Comparison: No prior spine imaging.

CLINICAL DATA: 32-year-old female with chronic scoliosis, recent
onset back pain.

EXAM:
DG SCOLIOSIS EVAL COMPLETE SPINE 2-3V

[dg scoliosis eval complete spine 2 or 3 ]
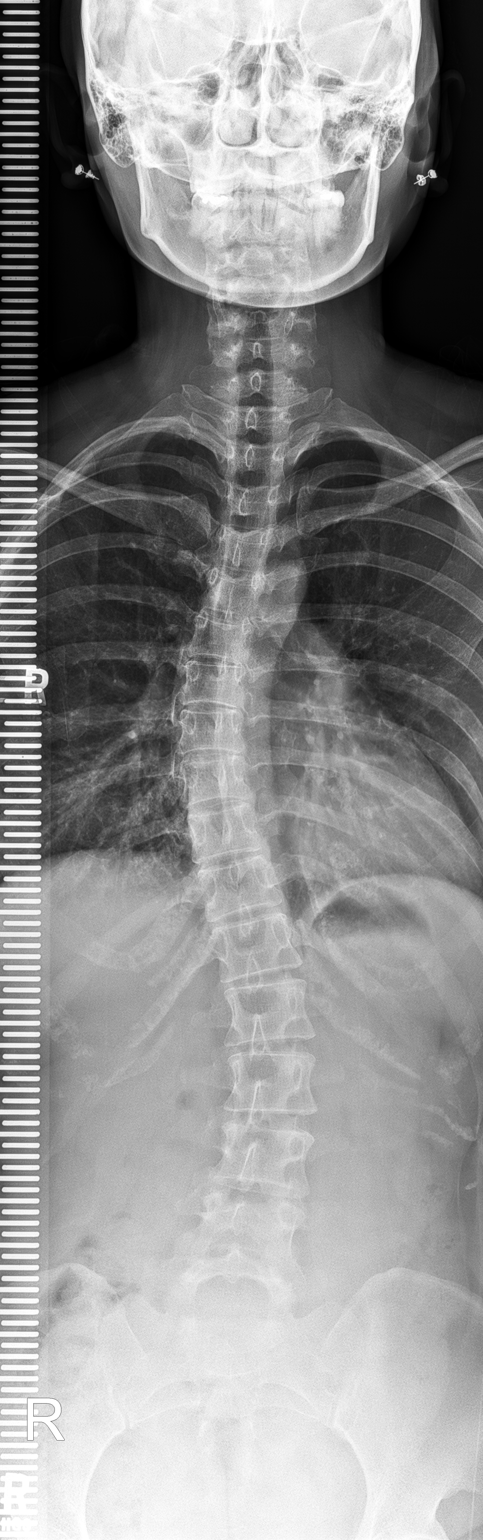

[1 of 1 positions shown; findings below may reference images not displayed]

FINDINGS: Bone mineralization is within normal limits. Normal thoracic and
lumbar segmentation noted on this AP view. Negative visible chest,
abdominal and pelvic visceral contours. Relatively preserved disc
spaces.

There is dextroconvex thoracic scoliosis measuring 29 degrees from
the T4 to the T12 level, apex at T7-T8.

There is superimposed 20 degrees of levoconvex lumbar scoliosis,
apex at L1.
IMPRESSION: 1. Moderate S shaped thoracolumbar scoliosis
- 29 degree dextroconvex thoracic curvature with apex at T7-T8, and
- 20 degree levoconvex lumbar curvature with apex at L1.
2.  No acute osseous abnormality identified.

## 2019-11-09 ENCOUNTER — Other Ambulatory Visit: Payer: Self-pay | Admitting: Surgical

## 2019-11-09 MED ORDER — NITROFURANTOIN MONOHYD MACRO 100 MG PO CAPS: 100.0000 mg | ORAL_CAPSULE | Freq: Two times a day (BID) | ORAL | 1 refills | Status: DC

## 2019-11-18 ENCOUNTER — Telehealth: Payer: Self-pay

## 2019-11-18 NOTE — Telephone Encounter (Signed)
Called pt and to ask if she would be applying for medicaid. The pt denies that she will apply for Medicaid and will be using her [employer]. I told the pt we will be working on her ob payment plan the pt verbally understood

## 2019-12-01 ENCOUNTER — Encounter: Payer: Self-pay | Admitting: Obstetrics and Gynecology

## 2019-12-01 ENCOUNTER — Other Ambulatory Visit: Payer: Self-pay

## 2019-12-01 ENCOUNTER — Ambulatory Visit (INDEPENDENT_AMBULATORY_CARE_PROVIDER_SITE_OTHER): Payer: BC Managed Care – PPO | Admitting: Obstetrics and Gynecology

## 2019-12-01 VITALS — BP 105/71 | HR 76 | Wt 130.8 lb

## 2019-12-01 DIAGNOSIS — Z3491 Encounter for supervision of normal pregnancy, unspecified, first trimester: Secondary | ICD-10-CM

## 2019-12-01 DIAGNOSIS — Z3A12 12 weeks gestation of pregnancy: Secondary | ICD-10-CM

## 2019-12-01 NOTE — Progress Notes (Signed)
NOB: Patient says nausea and vomiting resolving.  No longer taking antiemetics.  She still would like to eat healthier.  Of significant note husband has been recently diagnosed with stage IV lung cancer and has begun a chemotherapy regimen that require some degree of separation from her.  She will inquire about this further from the doctors prescribing.  She is considering genetic testing and AFP but has not yet decided.  Physical examination General NAD, Conversant  HEENT Atraumatic; Op clear with mmm.  Normo-cephalic. Pupils reactive. Anicteric sclerae  Thyroid/Neck Smooth without nodularity or enlargement. Normal ROM.  Neck Supple.  Skin No rashes, lesions or ulceration. Normal palpated skin turgor. No nodularity.  Breasts: No masses or discharge.  Symmetric.  No axillary adenopathy.  Lungs: Clear to auscultation.No rales or wheezes. Normal Respiratory effort, no retractions.  Heart: NSR.  No murmurs or rubs appreciated. No periferal edema  Abdomen: Soft.  Non-tender.  No masses.  No HSM. No hernia  Extremities: Moves all appropriately.  Normal ROM for age. No lymphadenopathy.  Neuro: Oriented to PPT.  Normal mood. Normal affect.     Pelvic:   Vulva: Normal appearance.  No lesions.  Vagina: No lesions or abnormalities noted.  Support: Normal pelvic support.  Urethra No masses tenderness or scarring.  Meatus Normal size without lesions or prolapse.  Cervix: Normal appearance.  No lesions.  Anus: Normal exam.  No lesions.  Perineum: Normal exam.  No lesions.        Bimanual   Adnexae: No masses.  Non-tender to palpation.  Uterus: Enlarged. 14wks  Pos FHTs  Non-tender.  Mobile.  AV.  Adnexae: No masses.  Non-tender to palpation.  Cul-de-sac: Negative for abnormality.  Adnexae: No masses.  Non-tender to palpation.         Pelvimetry   Diagonal: Reached.  Spines: Average.  Sacrum: Concave.  Pubic Arch: Normal.

## 2019-12-01 NOTE — Progress Notes (Signed)
hyroid  

## 2019-12-30 ENCOUNTER — Other Ambulatory Visit: Payer: Self-pay

## 2019-12-30 ENCOUNTER — Encounter: Payer: Self-pay | Admitting: Obstetrics and Gynecology

## 2019-12-30 ENCOUNTER — Ambulatory Visit (INDEPENDENT_AMBULATORY_CARE_PROVIDER_SITE_OTHER): Payer: BC Managed Care – PPO | Admitting: Obstetrics and Gynecology

## 2019-12-30 VITALS — BP 100/65 | HR 99 | Wt 132.3 lb

## 2019-12-30 DIAGNOSIS — Z3482 Encounter for supervision of other normal pregnancy, second trimester: Secondary | ICD-10-CM

## 2019-12-30 DIAGNOSIS — R0989 Other specified symptoms and signs involving the circulatory and respiratory systems: Secondary | ICD-10-CM

## 2019-12-30 DIAGNOSIS — Z3A17 17 weeks gestation of pregnancy: Secondary | ICD-10-CM

## 2019-12-30 LAB — POCT URINALYSIS DIPSTICK OB
Bilirubin, UA: NEGATIVE
Blood, UA: NEGATIVE
Glucose, UA: NEGATIVE
Leukocytes, UA: NEGATIVE
Nitrite, UA: NEGATIVE
POC,PROTEIN,UA: NEGATIVE
Spec Grav, UA: 1.025 (ref 1.010–1.025)
Urobilinogen, UA: 0.2 E.U./dL
pH, UA: 6 (ref 5.0–8.0)

## 2019-12-30 NOTE — Patient Instructions (Signed)
Second Trimester of Pregnancy The second trimester is from week 14 through week 27 (months 4 through 6). The second trimester is often a time when you feel your best. Your body has adjusted to being pregnant, and you begin to feel better physically. Usually, morning sickness has lessened or quit completely, you may have more energy, and you may have an increase in appetite. The second trimester is also a time when the fetus is growing rapidly. At the end of the sixth month, the fetus is about 9 inches long and weighs about 1 pounds. You will likely begin to feel the baby move (quickening) between 16 and 20 weeks of pregnancy. Body changes during your second trimester Your body continues to go through many changes during your second trimester. The changes vary from woman to woman.  Your weight will continue to increase. You will notice your lower abdomen bulging out.  You may begin to get stretch marks on your hips, abdomen, and breasts.  You may develop headaches that can be relieved by medicines. The medicines should be approved by your health care provider.  You may urinate more often because the fetus is pressing on your bladder.  You may develop or continue to have heartburn as a result of your pregnancy.  You may develop constipation because certain hormones are causing the muscles that push waste through your intestines to slow down.  You may develop hemorrhoids or swollen, bulging veins (varicose veins).  You may have back pain. This is caused by: ? Weight gain. ? Pregnancy hormones that are relaxing the joints in your pelvis. ? A shift in weight and the muscles that support your balance.  Your breasts will continue to grow and they will continue to become tender.  Your gums may bleed and may be sensitive to brushing and flossing.  Dark spots or blotches (chloasma, mask of pregnancy) may develop on your face. This will likely fade after the baby is born.  A dark line from your  belly button to the pubic area (linea nigra) may appear. This will likely fade after the baby is born.  You may have changes in your hair. These can include thickening of your hair, rapid growth, and changes in texture. Some women also have hair loss during or after pregnancy, or hair that feels dry or thin. Your hair will most likely return to normal after your baby is born. What to expect at prenatal visits During a routine prenatal visit:  You will be weighed to make sure you and the fetus are growing normally.  Your blood pressure will be taken.  Your abdomen will be measured to track your baby's growth.  The fetal heartbeat will be listened to.  Any test results from the previous visit will be discussed. Your health care provider may ask you:  How you are feeling.  If you are feeling the baby move.  If you have had any abnormal symptoms, such as leaking fluid, bleeding, severe headaches, or abdominal cramping.  If you are using any tobacco products, including cigarettes, chewing tobacco, and electronic cigarettes.  If you have any questions. Other tests that may be performed during your second trimester include:  Blood tests that check for: ? Low iron levels (anemia). ? High blood sugar that affects pregnant women (gestational diabetes) between 33 and 28 weeks. ? Rh antibodies. This is to check for a protein on red blood cells (Rh factor).  Urine tests to check for infections, diabetes, or protein in the  urine.  An ultrasound to confirm the proper growth and development of the baby.  An amniocentesis to check for possible genetic problems.  Fetal screens for spina bifida and Down syndrome.  HIV (human immunodeficiency virus) testing. Routine prenatal testing includes screening for HIV, unless you choose not to have this test. Follow these instructions at home: Medicines  Follow your health care provider's instructions regarding medicine use. Specific medicines may be  either safe or unsafe to take during pregnancy.  Take a prenatal vitamin that contains at least 600 micrograms (mcg) of folic acid.  If you develop constipation, try taking a stool softener if your health care provider approves. Eating and drinking   Eat a balanced diet that includes fresh fruits and vegetables, whole grains, good sources of protein such as meat, eggs, or tofu, and low-fat dairy. Your health care provider will help you determine the amount of weight gain that is right for you.  Avoid raw meat and uncooked cheese. These carry germs that can cause birth defects in the baby.  If you have low calcium intake from food, talk to your health care provider about whether you should take a daily calcium supplement.  Limit foods that are high in fat and processed sugars, such as fried and sweet foods.  To prevent constipation: ? Drink enough fluid to keep your urine clear or pale yellow. ? Eat foods that are high in fiber, such as fresh fruits and vegetables, whole grains, and beans. Activity  Exercise only as directed by your health care provider. Most women can continue their usual exercise routine during pregnancy. Try to exercise for 30 minutes at least 5 days a week. Stop exercising if you experience uterine contractions.  Avoid heavy lifting, wear low heel shoes, and practice good posture.  A sexual relationship may be continued unless your health care provider directs you otherwise. Relieving pain and discomfort  Wear a good support bra to prevent discomfort from breast tenderness.  Take warm sitz baths to soothe any pain or discomfort caused by hemorrhoids. Use hemorrhoid cream if your health care provider approves.  Rest with your legs elevated if you have leg cramps or low back pain.  If you develop varicose veins, wear support hose. Elevate your feet for 15 minutes, 3-4 times a day. Limit salt in your diet. Prenatal Care  Write down your questions. Take them to  your prenatal visits.  Keep all your prenatal visits as told by your health care provider. This is important. Safety  Wear your seat belt at all times when driving.  Make a list of emergency phone numbers, including numbers for family, friends, the hospital, and police and fire departments. General instructions  Ask your health care provider for a referral to a local prenatal education class. Begin classes no later than the beginning of month 6 of your pregnancy.  Ask for help if you have counseling or nutritional needs during pregnancy. Your health care provider can offer advice or refer you to specialists for help with various needs.  Do not use hot tubs, steam rooms, or saunas.  Do not douche or use tampons or scented sanitary pads.  Do not cross your legs for long periods of time.  Avoid cat litter boxes and soil used by cats. These carry germs that can cause birth defects in the baby and possibly loss of the fetus by miscarriage or stillbirth.  Avoid all smoking, herbs, alcohol, and unprescribed drugs. Chemicals in these products can affect the formation  and growth of the baby.  Do not use any products that contain nicotine or tobacco, such as cigarettes and e-cigarettes. If you need help quitting, ask your health care provider.  Visit your dentist if you have not gone yet during your pregnancy. Use a soft toothbrush to brush your teeth and be gentle when you floss. Contact a health care provider if:  You have dizziness.  You have mild pelvic cramps, pelvic pressure, or nagging pain in the abdominal area.  You have persistent nausea, vomiting, or diarrhea.  You have a bad smelling vaginal discharge.  You have pain when you urinate. Get help right away if:  You have a fever.  You are leaking fluid from your vagina.  You have spotting or bleeding from your vagina.  You have severe abdominal cramping or pain.  You have rapid weight gain or weight loss.  You have  shortness of breath with chest pain.  You notice sudden or extreme swelling of your face, hands, ankles, feet, or legs.  You have not felt your baby move in over an hour.  You have severe headaches that do not go away when you take medicine.  You have vision changes. Summary  The second trimester is from week 14 through week 27 (months 4 through 6). It is also a time when the fetus is growing rapidly.  Your body goes through many changes during pregnancy. The changes vary from woman to woman.  Avoid all smoking, herbs, alcohol, and unprescribed drugs. These chemicals affect the formation and growth your baby.  Do not use any tobacco products, such as cigarettes, chewing tobacco, and e-cigarettes. If you need help quitting, ask your health care provider.  Contact your health care provider if you have any questions. Keep all prenatal visits as told by your health care provider. This is important. This information is not intended to replace advice given to you by your health care provider. Make sure you discuss any questions you have with your health care provider. Document Revised: 05/09/2018 Document Reviewed: 02/21/2016 Elsevier Patient Education  [employer].   Common Medications Safe in Pregnancy  Acne:      Constipation:  Benzoyl Peroxide     Colace  Clindamycin      Dulcolax Suppository  Topica Erythromycin     Fibercon  Salicylic Acid      Metamucil         Miralax AVOID:        Senakot   Accutane    Cough:  Retin-A       Cough Drops  Tetracycline      Phenergan w/ Codeine if Rx  Minocycline      Robitussin (Plain & DM)  Antibiotics:     Crabs/Lice:  Ceclor       RID  Cephalosporins    AVOID:  E-Mycins      Kwell  Keflex  Macrobid/Macrodantin   Diarrhea:  Penicillin      Kao-Pectate  Zithromax      Imodium AD         PUSH FLUIDS AVOID:       Cipro     Fever:  Tetracycline      Tylenol (Regular or Extra  Minocycline       Strength)  Levaquin      Extra  Strength-Do not          Exceed 8 tabs/24 hrs Caffeine:        <255m/day (equiv. To 1 cup of  coffee or  approx. 3 12 oz sodas)         Gas: Cold/Hayfever:       Gas-X  Benadryl      Mylicon  Claritin       Phazyme  **Claritin-D        Chlor-Trimeton    Headaches:  Dimetapp      ASA-Free Excedrin  Drixoral-Non-Drowsy     Cold Compress  Mucinex (Guaifenasin)     Tylenol (Regular or Extra  Sudafed/Sudafed-12 Hour     Strength)  **Sudafed PE Pseudoephedrine   Tylenol Cold & Sinus     Vicks Vapor Rub  Zyrtec  **AVOID if Problems With Blood Pressure         Heartburn: Avoid lying down for at least 1 hour after meals  Aciphex      Maalox     Rash:  Milk of Magnesia     Benadryl    Mylanta       1% Hydrocortisone Cream  Pepcid  Pepcid Complete   Sleep Aids:  Prevacid      Ambien   Prilosec       Benadryl  Rolaids       Chamomile Tea  Tums (Limit 4/day)     Unisom         Tylenol PM         Warm milk-add vanilla or  Hemorrhoids:       Sugar for taste  Anusol/Anusol H.C.  (RX: Analapram 2.5%)  Sugar Substitutes:  Hydrocortisone OTC     Ok in moderation  Preparation H      Tucks        Vaseline lotion applied to tissue with wiping    Herpes:     Throat:  Acyclovir      Oragel  Famvir  Valtrex     Vaccines:         Flu Shot Leg Cramps:       *Gardasil  Benadryl      Hepatitis A         Hepatitis B Nasal Spray:       Pneumovax  Saline Nasal Spray     Polio Booster         Tetanus Nausea:       Tuberculosis test or PPD  Vitamin B6 25 mg TID   AVOID:    Dramamine      *Gardasil  Emetrol       Live Poliovirus  Ginger Root 250 mg QID    MMR (measles, mumps &  High Complex Carbs @ Bedtime    rebella)  Sea Bands-Accupressure    Varicella (Chickenpox)  Unisom 1/2 tab TID     *No known complications           If received before Pain:         Known pregnancy;   Darvocet       Resume series  after  Lortab        Delivery  Percocet    Yeast:   Tramadol      Femstat  Tylenol 3      Gyne-lotrimin  Ultram       Monistat  Vicodin           MISC:         All Sunscreens           Hair Coloring/highlights          Insect Repellant's          (  Including DEET)         Mystic Tans

## 2019-12-30 NOTE — Progress Notes (Signed)
ROB: Patient noting URI symptoms (notes her son had a cold a week ago, thinks she caught it from him). All have been tested for COVID, were negative. Discussed safe meds in pregnancy. Will hold on flu vaccine until next visit. Discussed genetic screening again, patient concerned about cost due to insurance possibly not covering (notes husband was recently diagnosed with lung cancer and is undergoing treatments, had genetic testing which cost ~ $[phone]). Given info for [facility], also will see if she can apply for Medicaid as a secondary insurance as her family are all on her husband's plan.  Given handout on [facility] [employer]) screen, as they will hold test if insurance does not cover. Patient ok to contact. RTC in 4 weeks, for anatomy scan at that time.

## 2019-12-30 NOTE — Progress Notes (Signed)
ROB-Pt present for routine prenatal care. Pt c/o of sore throat, headaches, and abd pain from coughing. Pt stated that she tested negative for covid.

## 2020-01-26 ENCOUNTER — Ambulatory Visit (INDEPENDENT_AMBULATORY_CARE_PROVIDER_SITE_OTHER): Payer: BC Managed Care – PPO | Admitting: Obstetrics and Gynecology

## 2020-01-26 ENCOUNTER — Ambulatory Visit (INDEPENDENT_AMBULATORY_CARE_PROVIDER_SITE_OTHER): Payer: BC Managed Care – PPO

## 2020-01-26 ENCOUNTER — Other Ambulatory Visit: Payer: Self-pay

## 2020-01-26 VITALS — BP 104/66 | HR 76 | Wt 136.3 lb

## 2020-01-26 DIAGNOSIS — Z3A20 20 weeks gestation of pregnancy: Secondary | ICD-10-CM | POA: Diagnosis not present

## 2020-01-26 DIAGNOSIS — Z3482 Encounter for supervision of other normal pregnancy, second trimester: Secondary | ICD-10-CM

## 2020-01-26 LAB — POCT URINALYSIS DIPSTICK OB
Bilirubin, UA: NEGATIVE
Glucose, UA: NEGATIVE
Ketones, UA: NEGATIVE
Leukocytes, UA: NEGATIVE
Nitrite, UA: NEGATIVE
POC,PROTEIN,UA: NEGATIVE
Spec Grav, UA: 1.015 (ref 1.010–1.025)
Urobilinogen, UA: 0.2 E.U./dL
pH, UA: 6.5 (ref 5.0–8.0)

## 2020-01-26 NOTE — Progress Notes (Signed)
ROB: FAS done today.  Normal female infant  (both parents very excited).  Mom feels much better.  Able to eat.

## 2020-01-30 NOTE — L&D Delivery Note (Signed)
       Delivery Note   [name] is a 35 y.o. G2P1001 at [redacted]w[redacted]d Estimated Date of Delivery: 06/09/20  PRE-OPERATIVE DIAGNOSIS:  1) [redacted]w[redacted]d pregnancy.  2) Advanced labor  POST-OPERATIVE DIAGNOSIS:  1) [redacted]w[redacted]d pregnancy s/p Vaginal, Spontaneous  2) Viable female infant  Delivery Type: Vaginal, Spontaneous    Delivery Anesthesia: None   Labor Complications:      ESTIMATED BLOOD LOSS: 125 ml    FINDINGS:   1) female infant, Apgar scores of 9   at 1 minute and 9   at 5 minutes and a birthweight of   ounces.    2) Nuchal cord: Yes  SPECIMENS:   PLACENTA:   Appearance: Intact    Removal: Spontaneous      Disposition:    DISPOSITION:  Infant to left in stable condition in the delivery room, with L&D personnel and mother,  NARRATIVE SUMMARY: Labor course:  [name] is a G2P1001 at [redacted]w[redacted]d who presented for labor management.  She progressed well in labor without pitocin. She evidenced good maternal expulsive effort during the second stage. She went on to deliver a viable infant. The placenta delivered without problems and was noted to be complete. A perineal and vaginal examination was performed. Episiotomy/Lacerations:  First degree midline Episiotomy or lacerations were repaired with Vicryl suture using local anesthesia. The patient tolerated this well.  Elonda Husky, M.D. 05/23/2020 3:29 AM

## 2020-02-24 ENCOUNTER — Encounter: Payer: Self-pay | Admitting: Obstetrics and Gynecology

## 2020-02-24 ENCOUNTER — Ambulatory Visit (INDEPENDENT_AMBULATORY_CARE_PROVIDER_SITE_OTHER): Payer: BC Managed Care – PPO | Admitting: Obstetrics and Gynecology

## 2020-02-24 ENCOUNTER — Other Ambulatory Visit: Payer: Self-pay

## 2020-02-24 VITALS — BP 97/61 | HR 108 | Wt 143.6 lb

## 2020-02-24 DIAGNOSIS — J3489 Other specified disorders of nose and nasal sinuses: Secondary | ICD-10-CM | POA: Diagnosis not present

## 2020-02-24 DIAGNOSIS — Z3A24 24 weeks gestation of pregnancy: Secondary | ICD-10-CM | POA: Diagnosis not present

## 2020-02-24 DIAGNOSIS — Z3482 Encounter for supervision of other normal pregnancy, second trimester: Secondary | ICD-10-CM

## 2020-02-24 LAB — POCT URINALYSIS DIPSTICK OB
Bilirubin, UA: NEGATIVE
Blood, UA: NEGATIVE
Glucose, UA: NEGATIVE
Ketones, UA: NEGATIVE
Leukocytes, UA: NEGATIVE
Nitrite, UA: NEGATIVE
POC,PROTEIN,UA: NEGATIVE
Spec Grav, UA: 1.02 (ref 1.010–1.025)
Urobilinogen, UA: 0.2 E.U./dL
pH, UA: 6.5 (ref 5.0–8.0)

## 2020-02-24 NOTE — Patient Instructions (Addendum)
[phone] and Pregnancy  Pregnant and recently pregnant women should take steps to stay healthy, including . getting a COVID-19 vaccine  . following guidelines from health officials for when to wear a mask and take other steps to prevent infection . keeping your prenatal and postpartum care visits . talking with an OB-GYN or other health care professional if you have any questions about your health or COVID-19 . calling 911 or going to the hospital right away if you need emergency health care   If you think you may have been exposed to the coronavirus and have a fever or cough, call your ob-gyn or other health care professional for advice. If you have emergency warning signs, call 911 or go to the hospital right away. Emergency warning signs include the following: . Having a hard time breathing or shortness of breath (more than what has been normal for you during pregnancy) . Ongoing pain or pressure in the chest . Sudden confusion . Being unable to respond to others . Blue lips or face . Decreased fetal movement/absent of fetal movement . Fever greater than 100.4  If you go to the hospital, try to call ahead to let them know you are coming so they can prepare. If you have other symptoms that worry you, call your OB-GYN or 911.   If you are diagnosed with COVID-19, follow the advice from the [facility] and your ob-gyn or other health care professional. The current CDC advice for all people with COVID-19 includes the following: . Stay home except to get medical care. Avoid public transportation. Marland Kitchen Speak with your health care team over the phone before going to their office. Get medical care right away if you feel worse or think it's an emergency. . Separate yourself from other people in your home. . Wear a face mask when you are around other people and when you go to get medical care. . Use the safe medication list to treat the symptoms i.e., cough, congestion, sore throat, fever. . If you are  having nausea and are unable to hold down liquids or food contact the office as we can prescribe medication. . Stay hydrate. Frequent sips of water, broth, ice chips, and popsicle. . Small bland meals.  . Hand hygiene- Wash hand frequently. . Wipe down surfaces.     Second Trimester of Pregnancy  The second trimester of pregnancy is from week 13 through week 27. This is also called months 4 through 6 of pregnancy. This is often the time when you feel your best. During the second trimester:  Morning sickness is less or has stopped.  You may have more energy.  You may feel hungry more often. At this time, your unborn baby (fetus) is growing very fast. At the end of the sixth month, the unborn baby may be up to 12 inches long and weigh about 1 pounds. You will likely start to feel the baby move between 16 and 20 weeks of pregnancy. Body changes during your second trimester Your body continues to go through many changes during this time. The changes vary and generally return to normal after the baby is born. Physical changes  You will gain more weight.  You may start to get stretch marks on your hips, belly (abdomen), and breasts.  Your breasts will grow and may hurt.  Dark spots or blotches may develop on your face.  A dark line from your belly button to the pubic area (linea nigra) may appear.  You may have  changes in your hair. Health changes  You may have headaches.  You may have heartburn.  You may have trouble pooping (constipation).  You may have hemorrhoids or swollen, bulging veins (varicose veins).  Your gums may bleed.  You may pee (urinate) more often.  You may have back pain. Follow these instructions at home: Medicines  Take over-the-counter and prescription medicines only as told by your doctor. Some medicines are not safe during pregnancy.  Take a prenatal vitamin that contains at least 600 micrograms (mcg) of folic acid. Eating and drinking  Eat  healthy meals that include: ? Fresh fruits and vegetables. ? Whole grains. ? Good sources of protein, such as meat, eggs, or tofu. ? Low-fat dairy products.  Avoid raw meat and unpasteurized juice, milk, and cheese.  You may need to take these actions to prevent or treat trouble pooping: ? Drink enough fluids to keep your pee (urine) pale yellow. ? Eat foods that are high in fiber. These include beans, whole grains, and fresh fruits and vegetables. ? Limit foods that are high in fat and sugar. These include fried or sweet foods. Activity  Exercise only as told by your doctor. Most people can do their usual exercise during pregnancy. Try to exercise for 30 minutes at least 5 days a week.  Stop exercising if you have pain or cramps in your belly or lower back.  Do not exercise if it is too hot or too humid, or if you are in a place of great height (high altitude).  Avoid heavy lifting.  If you choose to, you may have sex unless your doctor tells you not to. Relieving pain and discomfort  Wear a good support bra if your breasts are sore.  Take warm water baths (sitz baths) to soothe pain or discomfort caused by hemorrhoids. Use hemorrhoid cream if your doctor approves.  Rest with your legs raised (elevated) if you have leg cramps or low back pain.  If you develop bulging veins in your legs: ? Wear support hose as told by your doctor. ? Raise your feet for 15 minutes, 3-4 times a day. ? Limit salt in your food. Safety  Wear your seat belt at all times when you are in a car.  Talk with your doctor if someone is hurting you or yelling at you a lot. Lifestyle  Do not use hot tubs, steam rooms, or saunas.  Do not douche. Do not use tampons or scented sanitary pads.  Avoid cat litter boxes and soil used by cats. These carry germs that can harm your baby and can cause a loss of your baby by miscarriage or stillbirth.  Do not use herbal medicines, illegal drugs, or medicines that  are not approved by your doctor. Do not drink alcohol.  Do not smoke or use any products that contain nicotine or tobacco. If you need help quitting, ask your doctor. General instructions  Keep all follow-up visits. This is important.  Ask your doctor about local prenatal classes.  Ask your doctor about the right foods to eat or for help finding a counselor. Where to find more information  American Pregnancy Association: americanpregnancy.org  [employer]: www.acog.org  Office on [employer]: KeywordPortfolios.com.br Contact a doctor if:  You have a headache that does not go away when you take medicine.  You have changes in how you see, or you see spots in front of your eyes.  You have mild cramps, pressure, or pain in  your lower belly.  You continue to feel like you may vomit (nauseous), you vomit, or you have watery poop (diarrhea).  You have bad-smelling fluid coming from your vagina.  You have pain when you pee or your pee smells bad.  You have very bad swelling of your face, hands, ankles, feet, or legs.  You have a fever. Get help right away if:  You are leaking fluid from your vagina.  You have spotting or bleeding from your vagina.  You have very bad belly cramping or pain.  You have trouble breathing.  You have chest pain.  You faint.  You have not felt your baby move for the time period told by your doctor.  You have new or increased pain, swelling, or redness in an arm or leg. Summary  The second trimester of pregnancy is from week 13 through week 27 (months 4 through 6).  Eat healthy meals.  Exercise as told by your doctor. Most people can do their usual exercise during pregnancy.  Do not use herbal medicines, illegal drugs, or medicines that are not approved by your doctor. Do not drink alcohol.  Call your doctor if you get sick or if you notice anything unusual about your pregnancy. This  information is not intended to replace advice given to you by your health care provider. Make sure you discuss any questions you have with your health care provider. Document Revised: 06/24/2019 Document Reviewed: 04/30/2019 Elsevier Patient Education  [place]. Common Medications Safe in Pregnancy  Acne:      Constipation:  Benzoyl Peroxide     Colace  Clindamycin      Dulcolax Suppository  Topica Erythromycin     Fibercon  Salicylic Acid      Metamucil         Miralax AVOID:        Senakot   Accutane    Cough:  Retin-A       Cough Drops  Tetracycline      Phenergan w/ Codeine if Rx  Minocycline      Robitussin (Plain & DM)  Antibiotics:     Crabs/Lice:  Ceclor       RID  Cephalosporins    AVOID:  E-Mycins      Kwell  Keflex  Macrobid/Macrodantin   Diarrhea:  Penicillin      Kao-Pectate  Zithromax      Imodium AD         PUSH FLUIDS AVOID:       Cipro     Fever:  Tetracycline      Tylenol (Regular or Extra  Minocycline       Strength)  Levaquin      Extra Strength-Do not          Exceed 8 tabs/24 hrs Caffeine:        <2109m/day (equiv. To 1 cup of coffee or  approx. 3 12 oz sodas)         Gas: Cold/Hayfever:       Gas-X  Benadryl      Mylicon  Claritin       Phazyme  **Claritin-D        Chlor-Trimeton    Headaches:  Dimetapp      ASA-Free Excedrin  Drixoral-Non-Drowsy     Cold Compress  Mucinex (Guaifenasin)     Tylenol (Regular or Extra  Sudafed/Sudafed-12 Hour     Strength)  **Sudafed PE Pseudoephedrine   Tylenol Cold & Sinus  Vicks Vapor Rub  Zyrtec  **AVOID if Problems With Blood Pressure         Heartburn: Avoid lying down for at least 1 hour after meals  Aciphex      Maalox     Rash:  Milk of Magnesia     Benadryl    Mylanta       1% Hydrocortisone Cream  Pepcid  Pepcid Complete   Sleep Aids:  Prevacid      Ambien   Prilosec       Benadryl  Rolaids       Chamomile Tea  Tums (Limit 4/day)     Unisom         Tylenol  PM         Warm milk-add vanilla or  Hemorrhoids:       Sugar for taste  Anusol/Anusol H.C.  (RX: Analapram 2.5%)  Sugar Substitutes:  Hydrocortisone OTC     Ok in moderation  Preparation H      Tucks        Vaseline lotion applied to tissue with wiping    Herpes:     Throat:  Acyclovir      Oragel  Famvir  Valtrex     Vaccines:         Flu Shot Leg Cramps:       *Gardasil  Benadryl      Hepatitis A         Hepatitis B Nasal Spray:       Pneumovax  Saline Nasal Spray     Polio Booster         Tetanus Nausea:       Tuberculosis test or PPD  Vitamin B6 25 mg TID   AVOID:    Dramamine      *Gardasil  Emetrol       Live Poliovirus  Ginger Root 250 mg QID    MMR (measles, mumps &  High Complex Carbs @ Bedtime    rebella)  Sea Bands-Accupressure    Varicella (Chickenpox)  Unisom 1/2 tab TID     *No known complications           If received before Pain:         Known pregnancy;   Darvocet       Resume series after  Lortab        Delivery  Percocet    Yeast:   Tramadol      Femstat  Tylenol 3      Gyne-lotrimin  Ultram       Monistat  Vicodin           MISC:         All Sunscreens           Hair Coloring/highlights          Insect Repellant's          (Including DEET)         Mystic Tans     GESTATIONAL DIABETES TESTING FOR PREGNANCY  Pregnant women can develop a condition known as Gestational Diabetes (diabetes brought on by pregnancy) which can pose a risk to both mother and baby. A glucose tolerance test is a common type of testing for potential gestational diabetes.  There are several tests intended to identify gestational diabetes in pregnant women. The first, called the Glucose Challenge Screening, is a preliminary screening test performed between 26-28 weeks. If a woman tests positive during this screening test, the second test,  called the Glucose Tolerance Test, may be performed. This test will diagnose whether diabetes exists or not by indicating whether or not  the body is using glucose (a type of sugar) effectively.  The Glucose Challenge Screening is now considered to be a standard test performed during the early part of the third trimester of pregnancy.  What is the Glucose Challenge Screening Test? No preparation is required prior to the test. During the test, the mother is asked to drink a sweet liquid (glucose) and then will have blood drawn one hour from having the drink, as blood glucose levels normally peak within one hour. No fasting is required prior to this test.  The test evaluates how your body processes sugar. A high level in your blood may indicate your body is not processing sugar effectively (positive test). If the results of this screen are positive, the woman may have the Glucose Tolerance Test performed. It is important to note that not all women who test positive for the Glucose Challenge Screening test are found to have diabetes upon further diagnosis.  What is the Glucose Tolerance Test? Prior to the taking the glucose tolerance test, your doctor will ask you to make sure and eat at least 159m of carbohydrates (about what you will get from a slice or two of bread) for three days prior to the time you will be asked to fast. You will not be permitted to eat or drink anything but sips of water for 14 hours prior to the test, so it is best to schedule the test for first thing in the morning.  Additionally, you should plan to have someone drive you to and from the test since your energy levels may be low and there is a slight possibility you may feel light-headed.  When you arrive, the technician will draw blood to measure your baseline "fasting blood glucose level". You will be asked to drink a larger volume (or more concentrated solution) of the glucose drink than was used in the initial Glucose Challenge Screening test. Your blood will be drawn and tested every hour for the next three hours.  The following are the values that the  American Diabetes Association considers to be abnormal during the Glucose Tolerance Test:  Interval Abnormal reading Fasting 95 mg/dl or higher One hour 180 mg/dl or higher Two hours 155 mg/dl or higher Three hours 140 mg/dl or higher  What if my Glucose Tolerance Test Results are Abnormal? If only one of your readings comes back abnormal, your doctor may suggest some changes to your diet and/or test you again later in the pregnancy. If two or more of your readings come back abnormal, you'll be diagnosed with Gestational Diabetes and your doctor or midwife will talk to you about a treatment plan. Treating diabetes during pregnancy is extremely important to protect the health of both mother and baby.   Compiled using information from the following sources:  1. American Diabetes Association  https://www.diabetes.org  2. Emedicine  https://www.emedicine.com  3. [employer] and [employer]

## 2020-02-24 NOTE — Progress Notes (Signed)
ROB: Noting some sinus pressure. Discussed home treatment measures. Discussed breastfeeding.  Thinks she will not take the flu vaccine. RTC in 4 weeks, for 28 week labs then.    The following were addressed during this visit:  Breastfeeding Education - Early initiation of breastfeeding    Comments: Keeps milk supply adequate, helps contract uterus and slow bleeding, and early milk is the perfect first food and is easy to digest.   - The importance of exclusive breastfeeding    Comments: Provides antibodies, Lower risk of breast and ovarian cancers, and type-2 diabetes,Helps your body recover, Reduced chance of SIDS.   - Risks of giving your baby anything other than breast milk if you are breastfeeding    Comments: Make the baby less content with breastfeeds, may make my baby more susceptible to illness, and may reduce my milk supply.   - Frequent feeding to help assure optimal milk production    Comments: Making a full supply of milk requires frequent removal of milk from breasts, infant will eat 8-12 times in 24 hours, if separated from infant use breast massage, hand expression and/ or pumping to remove milk from breasts.   - Exclusive breastfeeding for the first 6 months    Comments: Builds a healthy milk supply and keeps it up, protects baby from sickness and disease, and breastmilk has everything your baby needs for the first 6 months.

## 2020-02-24 NOTE — Progress Notes (Signed)
ROB-Pt present routine prenatal care. Pt stated having headaches and sinus issues. Pt was informed that pregnancy safe medication list was in her chart. Pt was advised to have her office visit summary printed for the information.

## 2020-03-24 ENCOUNTER — Other Ambulatory Visit: Payer: Self-pay

## 2020-03-24 ENCOUNTER — Encounter: Payer: Self-pay | Admitting: Obstetrics and Gynecology

## 2020-03-24 ENCOUNTER — Other Ambulatory Visit: Payer: BC Managed Care – PPO

## 2020-03-24 ENCOUNTER — Ambulatory Visit (INDEPENDENT_AMBULATORY_CARE_PROVIDER_SITE_OTHER): Payer: BC Managed Care – PPO | Admitting: Obstetrics and Gynecology

## 2020-03-24 VITALS — BP 98/65 | HR 92 | Wt 148.1 lb

## 2020-03-24 DIAGNOSIS — Z3482 Encounter for supervision of other normal pregnancy, second trimester: Secondary | ICD-10-CM

## 2020-03-24 DIAGNOSIS — Z3A29 29 weeks gestation of pregnancy: Secondary | ICD-10-CM

## 2020-03-24 DIAGNOSIS — Z23 Encounter for immunization: Secondary | ICD-10-CM

## 2020-03-24 LAB — POCT URINALYSIS DIPSTICK OB
Bilirubin, UA: NEGATIVE
Blood, UA: NEGATIVE
Clarity, UA: NEGATIVE
Glucose, UA: NEGATIVE
Ketones, UA: NEGATIVE
Leukocytes, UA: NEGATIVE
Nitrite, UA: NEGATIVE
POC,PROTEIN,UA: NEGATIVE
Spec Grav, UA: <=1.005 — AB (ref 1.010–1.025)
Urobilinogen, UA: 0.2 E.U./dL
pH, UA: 7 (ref 5.0–8.0)

## 2020-03-24 MED ORDER — TETANUS-DIPHTH-ACELL PERTUSSIS 5-2.5-18.5 LF-MCG/0.5 IM SUSY
0.5000 mL | PREFILLED_SYRINGE | Freq: Once | INTRAMUSCULAR | Status: AC
  Administered 2020-03-24: 0.5 mL via INTRAMUSCULAR

## 2020-03-24 NOTE — Progress Notes (Signed)
OB-Pt present for routine prenatal care. Pt stating having lower abd pain, pain in legs and back when standing.

## 2020-03-24 NOTE — Progress Notes (Signed)
ROB: Patient doing well.  Complains of occasional lower abdominal pressure when standing for long periods of time.  She has a belly band that she plans to try.  Reports daily fetal movement.  1 hour GCT today.

## 2020-03-24 NOTE — Patient Instructions (Addendum)
WHAT OB PATIENTS CAN EXPECT   Confirmation of pregnancy and ultrasound ordered if medically indicated-[redacted] weeks gestation  New OB (NOB) intake with nurse and New OB (NOB) labs- [redacted] weeks gestation  New OB (NOB) physical examination with provider- 11/[redacted] weeks gestation  Flu vaccine-[redacted] weeks gestation  Anatomy scan-[redacted] weeks gestation  Glucose tolerance test, blood work to test for anemia, T-dap vaccine-[redacted] weeks gestation  Vaginal swabs/cultures-STD/Group B strep-[redacted] weeks gestation  Appointments every 4 weeks until 28 weeks  Every 2 weeks from 28 weeks until 36 weeks  Weekly visits from 36 weeks until delivery  Common Medications Safe in Pregnancy  Acne:      Constipation:  Benzoyl Peroxide     Colace  Clindamycin      Dulcolax Suppository  Topica Erythromycin     Fibercon  Salicylic Acid      Metamucil         Miralax AVOID:        Senakot   Accutane    Cough:  Retin-A       Cough Drops  Tetracycline      Phenergan w/ Codeine if Rx  Minocycline      Robitussin (Plain & DM)  Antibiotics:     Crabs/Lice:  Ceclor       RID  Cephalosporins    AVOID:  E-Mycins      Kwell  Keflex  Macrobid/Macrodantin   Diarrhea:  Penicillin      Kao-Pectate  Zithromax      Imodium AD         PUSH FLUIDS AVOID:       Cipro     Fever:  Tetracycline      Tylenol (Regular or Extra  Minocycline       Strength)  Levaquin      Extra Strength-Do not          Exceed 8 tabs/24 hrs Caffeine:        <256m/day (equiv. To 1 cup of coffee or  approx. 3 12 oz sodas)         Gas: Cold/Hayfever:       Gas-X  Benadryl      Mylicon  Claritin       Phazyme  **Claritin-D        Chlor-Trimeton    Headaches:  Dimetapp      ASA-Free Excedrin  Drixoral-Non-Drowsy     Cold Compress  Mucinex (Guaifenasin)     Tylenol (Regular or Extra  Sudafed/Sudafed-12 Hour     Strength)  **Sudafed PE Pseudoephedrine   Tylenol Cold & Sinus     Vicks Vapor Rub  Zyrtec  **AVOID if Problems With Blood  Pressure         Heartburn: Avoid lying down for at least 1 hour after meals  Aciphex      Maalox     Rash:  Milk of Magnesia     Benadryl    Mylanta       1% Hydrocortisone Cream  Pepcid  Pepcid Complete   Sleep Aids:  Prevacid      Ambien   Prilosec       Benadryl  Rolaids       Chamomile Tea  Tums (Limit 4/day)     Unisom         Tylenol PM         Warm milk-add vanilla or  Hemorrhoids:       Sugar for taste  Anusol/Anusol H.C.  (RX: Analapram 2.5%)  Sugar Substitutes:  Hydrocortisone OTC     Ok in moderation  Preparation H      Tucks        Vaseline lotion applied to tissue with wiping    Herpes:     Throat:  Acyclovir      Oragel  Famvir  Valtrex     Vaccines:         Flu Shot Leg Cramps:       *Gardasil  Benadryl      Hepatitis A         Hepatitis B Nasal Spray:       Pneumovax  Saline Nasal Spray     Polio Booster         Tetanus Nausea:       Tuberculosis test or PPD  Vitamin B6 25 mg TID   AVOID:    Dramamine      *Gardasil  Emetrol       Live Poliovirus  Ginger Root 250 mg QID    MMR (measles, mumps &  High Complex Carbs @ Bedtime    rebella)  Sea Bands-Accupressure    Varicella (Chickenpox)  Unisom 1/2 tab TID     *No known complications           If received before Pain:         Known pregnancy;   Darvocet       Resume series after  Lortab        Delivery  Percocet    Yeast:   Tramadol      Femstat  Tylenol 3      Gyne-lotrimin  Ultram       Monistat  Vicodin           MISC:         All Sunscreens           Hair Coloring/highlights          Insect Repellant's          (Including DEET)         Mystic Tans Breastfeeding  Choosing to breastfeed is one of the best decisions you can make for yourself and your baby. A change in hormones during pregnancy causes your breasts to make breast milk in your milk-producing glands. Hormones prevent breast milk from being released before your baby is born. They also prompt milk flow after birth. Once  breastfeeding has begun, thoughts of your baby, as well as his or her sucking or crying, can stimulate the release of milk from your milk-producing glands. Benefits of breastfeeding Research shows that breastfeeding offers many health benefits for infants and mothers. It also offers a cost-free and convenient way to feed your baby. For your baby  Your first milk (colostrum) helps your baby's digestive system to function better.  Special cells in your milk (antibodies) help your baby to fight off infections.  Breastfed babies are less likely to develop asthma, allergies, obesity, or type 2 diabetes. They are also at lower risk for sudden infant death syndrome (SIDS).  Nutrients in breast milk are better able to meet your baby's needs compared to infant formula.  Breast milk improves your baby's brain development. For you  Breastfeeding helps to create a very special bond between you and your baby.  Breastfeeding is convenient. Breast milk costs nothing and is always available at the correct temperature.  Breastfeeding helps to burn calories. It helps you to lose the weight that you gained during pregnancy.  Breastfeeding  makes your uterus return faster to its size before pregnancy. It also slows bleeding (lochia) after you give birth.  Breastfeeding helps to lower your risk of developing type 2 diabetes, osteoporosis, rheumatoid arthritis, cardiovascular disease, and breast, ovarian, uterine, and endometrial cancer later in life. Breastfeeding basics Starting breastfeeding  Find a comfortable place to sit or lie down, with your neck and back well-supported.  Place a pillow or a rolled-up blanket under your baby to bring him or her to the level of your breast (if you are seated). Nursing pillows are specially designed to help support your arms and your baby while you breastfeed.  Make sure that your baby's tummy (abdomen) is facing your abdomen.  Gently massage your breast. With your  fingertips, massage from the outer edges of your breast inward toward the nipple. This encourages milk flow. If your milk flows slowly, you may need to continue this action during the feeding.  Support your breast with 4 fingers underneath and your thumb above your nipple (make the letter "C" with your hand). Make sure your fingers are well away from your nipple and your baby's mouth.  Stroke your baby's lips gently with your finger or nipple.  When your baby's mouth is open wide enough, quickly bring your baby to your breast, placing your entire nipple and as much of the areola as possible into your baby's mouth. The areola is the colored area around your nipple. ? More areola should be visible above your baby's upper lip than below the lower lip. ? Your baby's lips should be opened and extended outward (flanged) to ensure an adequate, comfortable latch. ? Your baby's tongue should be between his or her lower gum and your breast.  Make sure that your baby's mouth is correctly positioned around your nipple (latched). Your baby's lips should create a seal on your breast and be turned out (everted).  It is common for your baby to suck about 2-3 minutes in order to start the flow of breast milk. Latching Teaching your baby how to latch onto your breast properly is very important. An improper latch can cause nipple pain, decreased milk supply, and poor weight gain in your baby. Also, if your baby is not latched onto your nipple properly, he or she may swallow some air during feeding. This can make your baby fussy. Burping your baby when you switch breasts during the feeding can help to get rid of the air. However, teaching your baby to latch on properly is still the best way to prevent fussiness from swallowing air while breastfeeding. Signs that your baby has successfully latched onto your nipple  Silent tugging or silent sucking, without causing you pain. Infant's lips should be extended outward  (flanged).  Swallowing heard between every 3-4 sucks once your milk has started to flow (after your let-down milk reflex occurs).  Muscle movement above and in front of his or her ears while sucking. Signs that your baby has not successfully latched onto your nipple  Sucking sounds or smacking sounds from your baby while breastfeeding.  Nipple pain. If you think your baby has not latched on correctly, slip your finger into the corner of your baby's mouth to break the suction and place it between your baby's gums. Attempt to start breastfeeding again. Signs of successful breastfeeding Signs from your baby  Your baby will gradually decrease the number of sucks or will completely stop sucking.  Your baby will fall asleep.  Your baby's body will relax.  Your baby will retain a small amount of milk in his or her mouth.  Your baby will let go of your breast by himself or herself. Signs from you  Breasts that have increased in firmness, weight, and size 1-3 hours after feeding.  Breasts that are softer immediately after breastfeeding.  Increased milk volume, as well as a change in milk consistency and color by the fifth day of breastfeeding.  Nipples that are not sore, cracked, or bleeding. Signs that your baby is getting enough milk  Wetting at least 1-2 diapers during the first 24 hours after birth.  Wetting at least 5-6 diapers every 24 hours for the first week after birth. The urine should be clear or pale yellow by the age of 5 days.  Wetting 6-8 diapers every 24 hours as your baby continues to grow and develop.  At least 3 stools in a 24-hour period by the age of 5 days. The stool should be soft and yellow.  At least 3 stools in a 24-hour period by the age of 7 days. The stool should be seedy and yellow.  No loss of weight greater than 10% of birth weight during the first 3 days of life.  Average weight gain of 4-7 oz (113-198 g) per week after the age of 4  days.  Consistent daily weight gain by the age of 5 days, without weight loss after the age of 2 weeks. After a feeding, your baby may spit up a small amount of milk. This is normal. Breastfeeding frequency and duration Frequent feeding will help you make more milk and can prevent sore nipples and extremely full breasts (breast engorgement). Breastfeed when you feel the need to reduce the fullness of your breasts or when your baby shows signs of hunger. This is called "breastfeeding on demand." Signs that your baby is hungry include:  Increased alertness, activity, or restlessness.  Movement of the head from side to side.  Opening of the mouth when the corner of the mouth or cheek is stroked (rooting).  Increased sucking sounds, smacking lips, cooing, sighing, or squeaking.  Hand-to-mouth movements and sucking on fingers or hands.  Fussing or crying. Avoid introducing a pacifier to your baby in the first 4-6 weeks after your baby is born. After this time, you may choose to use a pacifier. Research has shown that pacifier use during the first year of a baby's life decreases the risk of sudden infant death syndrome (SIDS). Allow your baby to feed on each breast as long as he or she wants. When your baby unlatches or falls asleep while feeding from the first breast, offer the second breast. Because newborns are often sleepy in the first few weeks of life, you may need to awaken your baby to get him or her to feed. Breastfeeding times will vary from baby to baby. However, the following rules can serve as a guide to help you make sure that your baby is properly fed:  Newborns (babies 64 weeks of age or younger) may breastfeed every 1-3 hours.  Newborns should not go without breastfeeding for longer than 3 hours during the day or 5 hours during the night.  You should breastfeed your baby a minimum of 8 times in a 24-hour period. Breast milk pumping Pumping and storing breast milk allows you to  make sure that your baby is exclusively fed your breast milk, even at times when you are unable to breastfeed. This is especially important if you go back to  work while you are still breastfeeding, or if you are not able to be present during feedings. Your lactation consultant can help you find a method of pumping that works best for you and give you guidelines about how long it is safe to store breast milk.      Caring for your breasts while you breastfeed Nipples can become dry, cracked, and sore while breastfeeding. The following recommendations can help keep your breasts moisturized and healthy:  Avoid using soap on your nipples.  Wear a supportive bra designed especially for nursing. Avoid wearing underwire-style bras or extremely tight bras (sports bras).  Air-dry your nipples for 3-4 minutes after each feeding.  Use only cotton bra pads to absorb leaked breast milk. Leaking of breast milk between feedings is normal.  Use lanolin on your nipples after breastfeeding. Lanolin helps to maintain your skin's normal moisture barrier. Pure lanolin is not harmful (not toxic) to your baby. You may also hand express a few drops of breast milk and gently massage that milk into your nipples and allow the milk to air-dry. In the first few weeks after giving birth, some women experience breast engorgement. Engorgement can make your breasts feel heavy, warm, and tender to the touch. Engorgement peaks within 3-5 days after you give birth. The following recommendations can help to ease engorgement:  Completely empty your breasts while breastfeeding or pumping. You may want to start by applying warm, moist heat (in the shower or with warm, water-soaked hand towels) just before feeding or pumping. This increases circulation and helps the milk flow. If your baby does not completely empty your breasts while breastfeeding, pump any extra milk after he or she is finished.  Apply ice packs to your breasts  immediately after breastfeeding or pumping, unless this is too uncomfortable for you. To do this: ? Put ice in a plastic bag. ? Place a towel between your skin and the bag. ? Leave the ice on for 20 minutes, 2-3 times a day.  Make sure that your baby is latched on and positioned properly while breastfeeding. If engorgement persists after 48 hours of following these recommendations, contact your health care provider or a Science writer. Overall health care recommendations while breastfeeding  Eat 3 healthy meals and 3 snacks every day. Well-nourished mothers who are breastfeeding need an additional 450-500 calories a day. You can meet this requirement by increasing the amount of a balanced diet that you eat.  Drink enough water to keep your urine pale yellow or clear.  Rest often, relax, and continue to take your prenatal vitamins to prevent fatigue, stress, and low vitamin and mineral levels in your body (nutrient deficiencies).  Do not use any products that contain nicotine or tobacco, such as cigarettes and e-cigarettes. Your baby may be harmed by chemicals from cigarettes that pass into breast milk and exposure to secondhand smoke. If you need help quitting, ask your health care provider.  Avoid alcohol.  Do not use illegal drugs or marijuana.  Talk with your health care provider before taking any medicines. These include over-the-counter and prescription medicines as well as vitamins and herbal supplements. Some medicines that may be harmful to your baby can pass through breast milk.  It is possible to become pregnant while breastfeeding. If birth control is desired, ask your health care provider about options that will be safe while breastfeeding your baby. Where to find more information: [employer]: www.llli.org Contact a health care provider if:  You feel  like you want to stop breastfeeding or have become frustrated with breastfeeding.  Your nipples are  cracked or bleeding.  Your breasts are red, tender, or warm.  You have: ? Painful breasts or nipples. ? A swollen area on either breast. ? A fever or chills. ? Nausea or vomiting. ? Drainage other than breast milk from your nipples.  Your breasts do not become full before feedings by the fifth day after you give birth.  You feel sad and depressed.  Your baby is: ? Too sleepy to eat well. ? Having trouble sleeping. ? More than 1 week old and wetting fewer than 6 diapers in a 24-hour period. ? Not gaining weight by 5 days of age.  Your baby has fewer than 3 stools in a 24-hour period.  Your baby's skin or the white parts of his or her eyes become yellow. Get help right away if:  Your baby is overly tired (lethargic) and does not want to wake up and feed.  Your baby develops an unexplained fever. Summary  Breastfeeding offers many health benefits for infant and mothers.  Try to breastfeed your infant when he or she shows early signs of hunger.  Gently tickle or stroke your baby's lips with your finger or nipple to allow the baby to open his or her mouth. Bring the baby to your breast. Make sure that much of the areola is in your baby's mouth. Offer one side and burp the baby before you offer the other side.  Talk with your health care provider or lactation consultant if you have questions or you face problems as you breastfeed. This information is not intended to replace advice given to you by your health care provider. Make sure you discuss any questions you have with your health care provider. Document Revised: 04/11/2017 Document Reviewed: 02/17/2016 Elsevier Patient Education  2021 [employer]  

## 2020-03-25 LAB — CBC
Hematocrit: 35.2 % (ref 34.0–46.6)
Hemoglobin: 12.2 g/dL (ref 11.1–15.9)
MCH: 31.8 pg (ref 26.6–33.0)
MCHC: 34.7 g/dL (ref 31.5–35.7)
MCV: 92 fL (ref 79–97)
Platelets: 147 10*3/uL — ABNORMAL LOW (ref 150–450)
RBC: 3.84 x10E6/uL (ref 3.77–5.28)
RDW: 11.9 % (ref 11.7–15.4)
WBC: 9.2 10*3/uL (ref 3.4–10.8)

## 2020-03-25 LAB — HEPATITIS C ANTIBODY: Hep C Virus Ab: <0.1 s/co ratio (ref 0.0–0.9)

## 2020-03-25 LAB — GLUCOSE, 1 HOUR GESTATIONAL: Gestational Diabetes Screen: 143 mg/dL — ABNORMAL HIGH (ref 65–139)

## 2020-03-25 LAB — RPR: RPR Ser Ql: NONREACTIVE

## 2020-03-28 ENCOUNTER — Other Ambulatory Visit: Payer: Self-pay

## 2020-03-28 DIAGNOSIS — Z3483 Encounter for supervision of other normal pregnancy, third trimester: Secondary | ICD-10-CM

## 2020-03-31 ENCOUNTER — Other Ambulatory Visit: Payer: Self-pay

## 2020-03-31 ENCOUNTER — Other Ambulatory Visit: Payer: BC Managed Care – PPO

## 2020-03-31 DIAGNOSIS — Z3483 Encounter for supervision of other normal pregnancy, third trimester: Secondary | ICD-10-CM | POA: Diagnosis not present

## 2020-04-01 LAB — GESTATIONAL GLUCOSE TOLERANCE
Glucose, Fasting: 84 mg/dL (ref 65–94)
Glucose, GTT - 1 Hour: 176 mg/dL (ref 65–179)
Glucose, GTT - 2 Hour: 142 mg/dL (ref 65–154)
Glucose, GTT - 3 Hour: 116 mg/dL (ref 65–139)

## 2020-04-15 ENCOUNTER — Encounter: Payer: BC Managed Care – PPO | Admitting: Obstetrics and Gynecology

## 2020-04-19 ENCOUNTER — Encounter: Payer: Self-pay | Admitting: Obstetrics and Gynecology

## 2020-04-19 ENCOUNTER — Ambulatory Visit (INDEPENDENT_AMBULATORY_CARE_PROVIDER_SITE_OTHER): Payer: BC Managed Care – PPO | Admitting: Obstetrics and Gynecology

## 2020-04-19 ENCOUNTER — Other Ambulatory Visit: Payer: Self-pay

## 2020-04-19 VITALS — BP 108/66 | HR 98 | Wt 151.8 lb

## 2020-04-19 DIAGNOSIS — R0989 Other specified symptoms and signs involving the circulatory and respiratory systems: Secondary | ICD-10-CM

## 2020-04-19 DIAGNOSIS — M5432 Sciatica, left side: Secondary | ICD-10-CM

## 2020-04-19 DIAGNOSIS — Z3A32 32 weeks gestation of pregnancy: Secondary | ICD-10-CM

## 2020-04-19 DIAGNOSIS — Z3483 Encounter for supervision of other normal pregnancy, third trimester: Secondary | ICD-10-CM

## 2020-04-19 LAB — POCT URINALYSIS DIPSTICK OB
Bilirubin, UA: NEGATIVE
Blood, UA: NEGATIVE
Glucose, UA: NEGATIVE
Leukocytes, UA: NEGATIVE
Nitrite, UA: NEGATIVE
POC,PROTEIN,UA: NEGATIVE
Spec Grav, UA: <=1.005 — AB (ref 1.010–1.025)
Urobilinogen, UA: 0.2 E.U./dL
pH, UA: 6.5 (ref 5.0–8.0)

## 2020-04-19 NOTE — Patient Instructions (Signed)

## 2020-04-19 NOTE — Progress Notes (Signed)
OB-Pt present for routine prenatal care. Pt c/o coughing at night and throughout the day causing abd pain, buttocks/leg pains and swollen feet.

## 2020-04-19 NOTE — Progress Notes (Signed)
ROB: Patient notes cough for 1 weeks. Using natural remedies mostly. Notes her child was previously sick.  Notes abdominal soreness due to coughing.  Also advised on throat lozenges and Tylenol.  Also complains of difficulty walking due to sciatic nerve pain. Further discussed breastfeeding.   The following were addressed during this visit:  Breastfeeding Education - Nonpharmacological pain relief methods for labor    Comments: Deep breathing, focusing on pleasant things, movement and walking, heating pads or cold compress, massage and relaxation, continuous support from someone you trust, and Doulas   - The importance of early skin-to-skin contact    Comments: Keeps baby warm and secure, helps keep baby's blood sugar up and breathing steady, easier to bond and breastfeed, and helps calm baby.  - Rooming-in on a 24-hour basis    Comments: Easier to learn baby's feeding cues, easier to bond and get to know each other, and encourages milk production.   - Feeding on demand or baby-led feeding    Comments: Helps prevent breastfeeding complications, helps bring in good milk supply, prevents under or overfeeding, and helps baby feel content and satisfied   - Effective positioning and attachment    Comments: Helps my baby to get enough breast milk, helps to produce an adequate milk supply, and helps prevent nipple pain and damage   - Individualized Education    Comments: Contraindications to breastfeeding and other special medical conditions

## 2020-05-03 ENCOUNTER — Encounter: Payer: Self-pay | Admitting: Obstetrics and Gynecology

## 2020-05-03 ENCOUNTER — Other Ambulatory Visit: Payer: Self-pay

## 2020-05-03 ENCOUNTER — Ambulatory Visit (INDEPENDENT_AMBULATORY_CARE_PROVIDER_SITE_OTHER): Payer: BC Managed Care – PPO | Admitting: Obstetrics and Gynecology

## 2020-05-03 VITALS — BP 109/74 | HR 94 | Wt 155.3 lb

## 2020-05-03 DIAGNOSIS — Z3483 Encounter for supervision of other normal pregnancy, third trimester: Secondary | ICD-10-CM

## 2020-05-03 DIAGNOSIS — O26843 Uterine size-date discrepancy, third trimester: Secondary | ICD-10-CM

## 2020-05-03 DIAGNOSIS — Z3A34 34 weeks gestation of pregnancy: Secondary | ICD-10-CM

## 2020-05-03 LAB — POCT URINALYSIS DIPSTICK OB
Bilirubin, UA: NEGATIVE
Blood, UA: NEGATIVE
Glucose, UA: NEGATIVE
Ketones, UA: NEGATIVE
Leukocytes, UA: NEGATIVE
Nitrite, UA: NEGATIVE
POC,PROTEIN,UA: NEGATIVE
Spec Grav, UA: 1.01 (ref 1.010–1.025)
Urobilinogen, UA: 0.2 E.U./dL
pH, UA: 6.5 (ref 5.0–8.0)

## 2020-05-03 NOTE — Progress Notes (Signed)
ROB: With numerous issues.  She has pain in her lower extremities especially the left side with pain that starts in her butt and travels down her leg consistent with sciatica.  Sciatica again discussed and stretching exercises recommended.  Also complains of occasional back pain.  States that she has noticed a change in her discharge although it is not itching or burning-asymptomatic.  Concerned that the baby is getting too large.  Complains of pelvic discomfort because of the size of the baby.  States that her cough noted at last visit has resolved but now she has an increase in nasal discharge and is concerned she might get a cough again.  Discussed the use of Claritin for allergies. Ultrasound ordered for size greater than dates.

## 2020-05-09 ENCOUNTER — Encounter: Payer: Self-pay | Admitting: Obstetrics and Gynecology

## 2020-05-09 ENCOUNTER — Other Ambulatory Visit: Payer: Self-pay

## 2020-05-09 ENCOUNTER — Observation Stay
Admission: EM | Admit: 2020-05-09 | Discharge: 2020-05-09 | Disposition: A | Discharge status: Home / Self Care | Payer: BC Managed Care – PPO | Attending: Obstetrics and Gynecology | Admitting: Obstetrics and Gynecology

## 2020-05-09 DIAGNOSIS — Z3A35 35 weeks gestation of pregnancy: Secondary | ICD-10-CM

## 2020-05-09 DIAGNOSIS — O1203 Gestational edema, third trimester: Principal | ICD-10-CM | POA: Insufficient documentation

## 2020-05-09 DIAGNOSIS — O26893 Other specified pregnancy related conditions, third trimester: Secondary | ICD-10-CM | POA: Diagnosis not present

## 2020-05-09 DIAGNOSIS — G5600 Carpal tunnel syndrome, unspecified upper limb: Secondary | ICD-10-CM | POA: Diagnosis not present

## 2020-05-09 NOTE — OB Triage Note (Signed)
Pt presents to L/D triage with reported right hand "numbness/sensation" with transient swelling that has been ongoing for a week. Pt reports that it is worse at night with noticed trouble gripping. Pt reports bilateral lower extremity swelling that been ongoing for a few weeks. +1 edema noted, +2 reflexes, no clonus. Pt reports no headache, blurry vision, or epigastric pain. Pt reports no bleeding or LOF and positive fetal movement. Pt reports "change" in discharge- described as yellowish, that began a few weeks prior. Pt reports no itching or urinary symptoms. She has previously mentioned this to her OB.  Monitors applied and assessing. BP 128/94. Cycling q15 min.

## 2020-05-09 NOTE — OB Triage Note (Signed)
Pt discharged home in stable condition per MD order. Pt verbalized understanding of plan and reported no questions or concerns at this time. Pt has appt with OB next week. Pt instructed to call with any questions or concerns.

## 2020-05-09 NOTE — Discharge Summary (Signed)
    L&D OB Triage Note  SUBJECTIVE Darlene Ellis is a 35 y.o. G2P1001 female at [redacted]w[redacted]d, EDD Estimated Date of Delivery: 06/09/20 who presented to triage with complaints of increased swelling of her LE and new onset numbness and tingling in R hand. Denies contractions, LOF or bleeding. Denies HA, blurry vision etc.  OB History  Gravida Para Term Preterm AB Living  2 1 1  0 0 1  SAB IAB Ectopic Multiple Live Births  0 0 0 0 1    # Outcome Date GA Lbr Len/2nd Weight Sex Delivery Anes PTL Lv  2 Current           1 Term 01/07/17 [redacted]w[redacted]d 04:06 / 01:15 3150 g M Vag-Vacuum EPI  LIV     Name: Labella,BOY Darlene Ellis     Apgar1: 8  Apgar5: 9    No medications prior to admission.     OBJECTIVE  Nursing Evaluation:   BP 113/60   Pulse 84   Resp 18   Ht 4\' 11"  (1.499 m)   Wt 68.5 kg   LMP 09/03/2019 (Exact Date)   BMI 30.50 kg/m    Findings:        Minimal swelling in LE (+1)           NST was performed and has been reviewed by me.  NST INTERPRETATION: Category I  Mode: External Baseline Rate (A): 150 bpm (fht) Variability: Moderate Accelerations: 15 x 15 Decelerations: None     Contraction Frequency (min): none  ASSESSMENT Impression:  1.  Pregnancy:  G2P1001 at [redacted]w[redacted]d , EDD Estimated Date of Delivery: 06/09/20 2.  Reassuring fetal and maternal status 3.  Normal edema of pregnancy  4.  New onset mild carpel tunnel syndrome  PLAN 1. Discussed current condition and above findings and reassurance given.  All questions answered. 2. Discharge home with standard labor precautions given to return to L&D or call the office for problems. 3. Continue routine prenatal care. 4. Consider neutral wrist splint.

## 2020-05-17 ENCOUNTER — Encounter: Payer: BC Managed Care – PPO | Admitting: Obstetrics and Gynecology

## 2020-05-19 ENCOUNTER — Encounter: Payer: Self-pay | Admitting: Obstetrics and Gynecology

## 2020-05-19 ENCOUNTER — Other Ambulatory Visit: Payer: Self-pay

## 2020-05-19 ENCOUNTER — Ambulatory Visit (INDEPENDENT_AMBULATORY_CARE_PROVIDER_SITE_OTHER): Payer: BC Managed Care – PPO | Admitting: Obstetrics and Gynecology

## 2020-05-19 VITALS — BP 106/70 | HR 93 | Ht 59.0 in | Wt 157.7 lb

## 2020-05-19 DIAGNOSIS — O99613 Diseases of the digestive system complicating pregnancy, third trimester: Secondary | ICD-10-CM

## 2020-05-19 DIAGNOSIS — O99113 Other diseases of the blood and blood-forming organs and certain disorders involving the immune mechanism complicating pregnancy, third trimester: Secondary | ICD-10-CM

## 2020-05-19 DIAGNOSIS — D696 Thrombocytopenia, unspecified: Secondary | ICD-10-CM

## 2020-05-19 DIAGNOSIS — Z3483 Encounter for supervision of other normal pregnancy, third trimester: Secondary | ICD-10-CM | POA: Diagnosis not present

## 2020-05-19 DIAGNOSIS — K219 Gastro-esophageal reflux disease without esophagitis: Secondary | ICD-10-CM

## 2020-05-19 DIAGNOSIS — Z3A37 37 weeks gestation of pregnancy: Secondary | ICD-10-CM

## 2020-05-19 LAB — POCT URINALYSIS DIPSTICK OB
Bilirubin, UA: NEGATIVE
Blood, UA: NEGATIVE
Glucose, UA: NEGATIVE
Ketones, UA: NEGATIVE
Leukocytes, UA: NEGATIVE
Nitrite, UA: NEGATIVE
POC,PROTEIN,UA: NEGATIVE
Spec Grav, UA: <=1.005 — AB (ref 1.010–1.025)
Urobilinogen, UA: 0.2 E.U./dL
pH, UA: 6.5 (ref 5.0–8.0)

## 2020-05-19 MED ORDER — PANTOPRAZOLE SODIUM 20 MG PO TBEC: 20.0000 mg | DELAYED_RELEASE_TABLET | Freq: Every day | ORAL | 1 refills | Status: DC

## 2020-05-19 NOTE — Patient Instructions (Signed)

## 2020-05-19 NOTE — Progress Notes (Signed)
ROB: Feeling pelvic pressure and Braxton Hicks. Reports reflux not relieved by Tums. Prescribed Protonix. Notes having [place] scheduled for growth (concern last visit for size>dates) on May 3rd. FH today wnl, however possible oblique fetal positioning, cannot confirm vertex. Advised to keep ultrasound appt. For repeat CBC today to f/u mild gestational thrombocytopenia. 36 week cultures performed today. RTC in 1 week.

## 2020-05-19 NOTE — Progress Notes (Signed)
OB-Pt present for routine prenatal care. Pt c/o vaginal pain/pressure and braxton hick contractions.  

## 2020-05-20 LAB — CBC
Hematocrit: 40.6 % (ref 34.0–46.6)
Hemoglobin: 13.5 g/dL (ref 11.1–15.9)
MCH: 30.3 pg (ref 26.6–33.0)
MCHC: 33.3 g/dL (ref 31.5–35.7)
MCV: 91 fL (ref 79–97)
Platelets: 160 10*3/uL (ref 150–450)
RBC: 4.46 x10E6/uL (ref 3.77–5.28)
RDW: 12.8 % (ref 11.7–15.4)
WBC: 7.5 10*3/uL (ref 3.4–10.8)

## 2020-05-21 LAB — STREP GP B NAA: Strep Gp B NAA: POSITIVE — AB

## 2020-05-21 LAB — GC/CHLAMYDIA PROBE AMP
Chlamydia trachomatis, NAA: NEGATIVE
Neisseria Gonorrhoeae by PCR: NEGATIVE

## 2020-05-23 ENCOUNTER — Other Ambulatory Visit: Payer: Self-pay

## 2020-05-23 ENCOUNTER — Inpatient Hospital Stay
Admission: EM | Admit: 2020-05-23 | Discharge: 2020-05-25 | DRG: 807 | Disposition: A | Discharge status: Home / Self Care | Payer: BC Managed Care – PPO | Attending: Obstetrics and Gynecology | Admitting: Obstetrics and Gynecology

## 2020-05-23 ENCOUNTER — Encounter: Payer: Self-pay | Admitting: Obstetrics and Gynecology

## 2020-05-23 DIAGNOSIS — O99824 Streptococcus B carrier state complicating childbirth: Secondary | ICD-10-CM | POA: Diagnosis present

## 2020-05-23 DIAGNOSIS — Z3A37 37 weeks gestation of pregnancy: Secondary | ICD-10-CM | POA: Diagnosis not present

## 2020-05-23 DIAGNOSIS — O26893 Other specified pregnancy related conditions, third trimester: Secondary | ICD-10-CM | POA: Diagnosis not present

## 2020-05-23 DIAGNOSIS — Z20822 Contact with and (suspected) exposure to covid-19: Secondary | ICD-10-CM | POA: Diagnosis not present

## 2020-05-23 LAB — RAPID HIV SCREEN (HIV 1/2 AB+AG)
HIV 1/2 Antibodies: NONREACTIVE
HIV-1 P24 Antigen - HIV24: NONREACTIVE

## 2020-05-23 LAB — CBC
HCT: 39.3 % (ref 36.0–46.0)
Hemoglobin: 13.2 g/dL (ref 12.0–15.0)
MCH: 30.1 pg (ref 26.0–34.0)
MCHC: 33.6 g/dL (ref 30.0–36.0)
MCV: 89.7 fL (ref 80.0–100.0)
Platelets: 149 10*3/uL — ABNORMAL LOW (ref 150–400)
RBC: 4.38 MIL/uL (ref 3.87–5.11)
RDW: 13.2 % (ref 11.5–15.5)
WBC: 9.7 10*3/uL (ref 4.0–10.5)
nRBC: 0 % (ref 0.0–0.2)

## 2020-05-23 LAB — TYPE AND SCREEN
ABO/RH(D): A POS
Antibody Screen: NEGATIVE

## 2020-05-23 LAB — RESP PANEL BY RT-PCR (FLU A&B, COVID) ARPGX2
Influenza A by PCR: NEGATIVE
Influenza B by PCR: NEGATIVE
SARS Coronavirus 2 by RT PCR: NEGATIVE

## 2020-05-23 LAB — RPR: RPR Ser Ql: NONREACTIVE

## 2020-05-23 MED ORDER — OXYTOCIN BOLUS FROM INFUSION: 333.0000 mL | Freq: Once | INTRAVENOUS | Status: DC

## 2020-05-23 MED ORDER — IBUPROFEN 600 MG PO TABS
600.0000 mg | ORAL_TABLET | Freq: Four times a day (QID) | ORAL | Status: DC
  Administered 2020-05-23: 600 mg via ORAL
  Filled 2020-05-23: qty 1

## 2020-05-23 MED ORDER — ZOLPIDEM TARTRATE 5 MG PO TABS: 5.0000 mg | ORAL_TABLET | Freq: Every evening | ORAL | Status: DC | PRN

## 2020-05-23 MED ORDER — MISOPROSTOL 200 MCG PO TABS
ORAL_TABLET | ORAL | Status: AC
  Filled 2020-05-23: qty 4

## 2020-05-23 MED ORDER — TETANUS-DIPHTH-ACELL PERTUSSIS 5-2.5-18.5 LF-MCG/0.5 IM SUSY
0.5000 mL | PREFILLED_SYRINGE | Freq: Once | INTRAMUSCULAR | Status: DC
  Filled 2020-05-23: qty 0.5

## 2020-05-23 MED ORDER — OXYTOCIN-SODIUM CHLORIDE 30-0.9 UT/500ML-% IV SOLN
2.5000 [IU]/h | INTRAVENOUS | Status: DC | PRN
  Filled 2020-05-23: qty 500

## 2020-05-23 MED ORDER — OXYCODONE-ACETAMINOPHEN 5-325 MG PO TABS: 1.0000 | ORAL_TABLET | ORAL | Status: DC | PRN

## 2020-05-23 MED ORDER — OXYTOCIN BOLUS FROM INFUSION
333.0000 mL | Freq: Once | INTRAVENOUS | Status: AC
  Administered 2020-05-23: 333 mL via INTRAVENOUS

## 2020-05-23 MED ORDER — IBUPROFEN 600 MG PO TABS
600.0000 mg | ORAL_TABLET | Freq: Four times a day (QID) | ORAL | Status: DC
  Administered 2020-05-23 – 2020-05-25 (×8): 600 mg via ORAL
  Filled 2020-05-23 (×8): qty 1

## 2020-05-23 MED ORDER — DIPHENHYDRAMINE HCL 25 MG PO CAPS: 25.0000 mg | ORAL_CAPSULE | Freq: Four times a day (QID) | ORAL | Status: DC | PRN

## 2020-05-23 MED ORDER — DOCUSATE SODIUM 100 MG PO CAPS
100.0000 mg | ORAL_CAPSULE | Freq: Two times a day (BID) | ORAL | Status: DC
  Administered 2020-05-23 – 2020-05-25 (×4): 100 mg via ORAL
  Filled 2020-05-23 (×4): qty 1

## 2020-05-23 MED ORDER — ACETAMINOPHEN 325 MG PO TABS
650.0000 mg | ORAL_TABLET | ORAL | Status: DC | PRN
  Administered 2020-05-23: 650 mg via ORAL
  Filled 2020-05-23 (×2): qty 2

## 2020-05-23 MED ORDER — BENZOCAINE-MENTHOL 20-0.5 % EX AERO
1.0000 "application " | INHALATION_SPRAY | CUTANEOUS | Status: DC | PRN
  Administered 2020-05-23: 1 via TOPICAL
  Filled 2020-05-23 (×2): qty 56

## 2020-05-23 MED ORDER — OXYTOCIN-SODIUM CHLORIDE 30-0.9 UT/500ML-% IV SOLN
INTRAVENOUS | Status: AC
  Administered 2020-05-23: 2.5 [IU]/h via INTRAVENOUS
  Filled 2020-05-23: qty 1000

## 2020-05-23 MED ORDER — OXYTOCIN 10 UNIT/ML IJ SOLN
INTRAMUSCULAR | Status: AC
  Filled 2020-05-23: qty 2

## 2020-05-23 MED ORDER — PRENATAL MULTIVITAMIN CH
1.0000 | ORAL_TABLET | Freq: Every day | ORAL | Status: DC
  Administered 2020-05-23 – 2020-05-25 (×3): 1 via ORAL
  Filled 2020-05-23 (×3): qty 1

## 2020-05-23 MED ORDER — LIDOCAINE HCL (PF) 1 % IJ SOLN
INTRAMUSCULAR | Status: AC
  Administered 2020-05-23: 30 mL
  Filled 2020-05-23: qty 30

## 2020-05-23 MED ORDER — AMMONIA AROMATIC IN INHA
RESPIRATORY_TRACT | Status: AC
  Filled 2020-05-23: qty 10

## 2020-05-23 MED ORDER — SIMETHICONE 80 MG PO CHEW: 80.0000 mg | CHEWABLE_TABLET | ORAL | Status: DC | PRN

## 2020-05-23 NOTE — H&P (Signed)
History and Physical   HPI  Darlene Ellis is a 35 y.o. G2P1001 at [redacted]w[redacted]d Estimated Date of Delivery: 06/09/20 who is being admitted for labor management.  Began contractions @ midnight.  Feels the urge to push.   OB History  OB History  Gravida Para Term Preterm AB Living  2 1 1  0 0 1  SAB IAB Ectopic Multiple Live Births  0 0 0 0 1    # Outcome Date GA Lbr Len/2nd Weight Sex Delivery Anes PTL Lv  2 Current           1 Term 01/07/17 [redacted]w[redacted]d 04:06 / 01:15 3150 g M Vag-Vacuum EPI  LIV     Name: Marshburn,BOY Mikalah     Apgar1: 8  Apgar5: 9    PROBLEM LIST  Pregnancy complications or risks: Patient Active Problem List   Diagnosis Date Noted  . Labor and delivery, indication for care 05/23/2020  . URI (upper respiratory infection) 03/13/2017  . Labor and delivery indication for care or intervention 01/06/2017  . Urinary urgency 01/03/2017  . Gastroesophageal reflux in pregnancy 10/03/2016  . Pregnancy 09/21/2016  . Skin cyst 09/21/2016  . Right upper quadrant pain 09/21/2016  . PCO (polycystic ovaries) 10/18/2015    Prenatal labs and studies: ABO, Rh: A/Positive/-- (10/01 1417) Antibody: Negative (10/01 1417) Rubella: 6.72 (10/01 1417) RPR: Non Reactive (02/24 1052)  HBsAg: Negative (10/01 1417)  HIV:    01-13-1987-- (04/21 1543)   Past Medical History:  Diagnosis Date  . Endometrial polyp   . UTI (lower urinary tract infection)   . Yeast infection      Past Surgical History:  Procedure Laterality Date  . DILATION AND CURETTAGE OF UTERUS       Medications    Current Discharge Medication List    CONTINUE these medications which have NOT CHANGED   Details  calcium carbonate (TUMS - DOSED IN MG ELEMENTAL CALCIUM) 500 MG chewable tablet Chew 1 tablet by mouth daily.    pantoprazole (PROTONIX) 20 MG tablet Take 1 tablet (20 mg total) by mouth daily. Qty: 60 tablet, Refills: 1    Prenatal Vit-Fe Fumarate-FA (MULTIVITAMIN-PRENATAL) 27-0.8 MG TABS tablet  Take 1 tablet by mouth daily at 12 noon.         Allergies  Patient has no known allergies.  Review of Systems  Pertinent items noted in HPI and remainder of comprehensive ROS otherwise negative.  Physical Exam  LMP 09/03/2019 (Exact Date)   Lungs:  CTA B Cardio: RRR without M/R/G Abd: Soft, gravid, NT Presentation: cephalic EXT: No C/C/ 1+ Edema DTRs: 2+ B CERVIX: Dilation: 10 Effacement (%): 100 Station: 0 Exam by:: H Cahoon RN  See Prenatal records for more detailed PE.     FHR:  Variability: Good {> 6 bpm)  Toco: Uterine Contractions: Frequency: Every 3 minutes  Test Results  No results found for this or any previous visit (from the past 24 hour(s)). Group B Strep positive  Assessment   G2P1001 at [redacted]w[redacted]d Estimated Date of Delivery: 06/09/20  The fetus is reassuring.   Patient Active Problem List   Diagnosis Date Noted  . Labor and delivery, indication for care 05/23/2020  . URI (upper respiratory infection) 03/13/2017  . Labor and delivery indication for care or intervention 01/06/2017  . Urinary urgency 01/03/2017  . Gastroesophageal reflux in pregnancy 10/03/2016  . Pregnancy 09/21/2016  . Skin cyst 09/21/2016  . Right upper quadrant pain 09/21/2016  . PCO (polycystic ovaries)  10/18/2015    Plan  1. Admit to L&D :   2. EFM: -- Category 1 3. Admission labs  4. Expect Vaginal delivery  Elonda Husky, M.D. 05/23/2020 3:23 AM

## 2020-05-23 NOTE — Plan of Care (Signed)
Transferred to Room 336. Alert and oriented with pleasant affect. Assessment and VS WNL. Oriented to Room, Fall Prevention and POC. Patient v/o.

## 2020-05-24 NOTE — Progress Notes (Signed)
Patient ID: Darlene Ellis, female   DOB: February 02, 1985, 35 y.o.   MRN: [id number]   Progress Note - Vaginal Delivery  Darlene Ellis is a 35 y.o. G2P2002 now PP day 1 s/p Vaginal, Spontaneous .   Subjective:  The patient reports no complaints, up ad lib, voiding and tolerating PO  Objective:  Vital signs in last 24 hours: Temp:  [97.8 F (36.6 C)-98.4 F (36.9 C)] 97.9 F (36.6 C) (04/26 0757) Pulse Rate:  [71-94] 71 (04/26 0757) Resp:  [18] 18 (04/26 0757) BP: (112-121)/(61-82) 121/71 (04/26 0757) SpO2:  [100 %] 100 % (04/26 0757)  Physical Exam:  General: alert, cooperative and no distress Lochia: appropriate Uterine Fundus: firm    Data Review Recent Labs    05/23/20 0339  HGB 13.2  HCT 39.3    Assessment/Plan: Active Problems:   Labor and delivery, indication for care   Plan for discharge tomorrow   -- Continue routine PP care.     Elonda Husky, M.D. 05/24/2020 11:53 AM

## 2020-05-24 NOTE — Lactation Note (Signed)
This note was copied from a baby's chart. Lactation Consultation Note  Patient Name: Darlene Ellis [id number] Date: 05/24/2020 Reason for consult: Initial assessment;Early term 37-38.6wks;Mother's request Age:34 hours  Initial lactation visit with G2P2 mom who delivered SVD 30 hours ago. Mom previously breastfed her now 30yr- 4 months with breast and pumping, but cites low milk supply.  Mom questions her supply at this time based on baby being fussy. LC notes use of pacifier, and asked mom about it- mom gave to help calm baby. LC educated mom on impact early pacifier introduction can have on latching, feeding, masking hunger cues, and hindering the building of milk supply.  Baby cuing with LC in room. LC assisted with getting mom in comfortable position and latching baby in cross cradle on L breast. Baby was able to grasp breast easily, appeared somewhat shallow but mom notes no pain/discomfort. Baby did have a rhythmic sucking pattern, but not overly eager, and fell asleep quickly.  LC implemented strategies to keep baby awake/alert at the breast for full feeding, educated mom on output expectations to let us know how baby is feeding, and encouraged no more pacifier use.  Mom desires pump introduction to help her increase her supply and has ordered several foods to help her as well. LC encouraged a good overall healthy diet and plenty of water/fluids to stay hydrated.   Baby was still active at the breast with a few audible swallows heard/noted with LC in the room.  LC to return for pump introduction.  Maternal Data Has patient been taught Hand Expression?: Yes Does the patient have breastfeeding experience prior to this delivery?: Yes How long did the patient breastfeed?: 4 months  Feeding Mother's Current Feeding Choice: Breast Milk  LATCH Score Latch: Grasps breast easily, tongue down, lips flanged, rhythmical sucking.  Audible Swallowing: A few with stimulation  Type of Nipple:  Everted at rest and after stimulation  Comfort (Breast/Nipple): Soft / non-tender  Hold (Positioning): No assistance needed to correctly position infant at breast.  LATCH Score: 9   Lactation Tools Discussed/Used    Interventions Interventions: Breast feeding basics reviewed;Hand express;Adjust position;Support pillows;Education;Comfort gels  Discharge Pump: Personal Gaffer)  Consult Status Consult Status: Follow-up Date: 05/24/20 Follow-up type: In-patient    Danford Bad 05/24/2020, 11:46 AM

## 2020-05-25 NOTE — Progress Notes (Signed)
Pt discharged with infant.  Discharge instructions, prescriptions and follow up appointment given to and reviewed with pt. Pt verbalized understanding. Escorted out by auxillary. 

## 2020-05-25 NOTE — Lactation Note (Signed)
This note was copied from a baby's chart. Lactation Consultation Note  Patient Name: Darlene Ellis [id number] Date: 05/25/2020 Reason for consult: Follow-up assessment;Early term 37-38.6wks Age:34 hours  Lactation follow-up prior to anticipated discharge. Baby had 10% weight loss, cluster fed all day yesterday with good latch, however was falling asleep during the feedings.  Mom was set-up with DEBP, that she used 1x and stated that it was uncomfortable. Mom did use her own hand pump and was able to express a "drop".   Baby began supplement of Similac 360- up to 33mL per feeding. Mom has hx of low milk supply with her first. Mom has been encouraged to offer breast at each feeding, prior to formula, and to utilize hand or DEBP post feedings as much as she can to aid in stimulation and encouragement of onset of milk supply. (Mom has ordered a DEBP through her insurance, anticipated delivery in 3 days, will utilize her hand pump until then).  Reviewed newborn feeding behaviors and patterns, early cues, cluster feeding and growth spurts, output expectations.  Reviewed with mom possible breast fullness and engorgement and management of both, nipple care, when to seek care/support. Information for outpatient lactation services provided and community breastfeeding support given.  Maternal Data Has patient been taught Hand Expression?: Yes Does the patient have breastfeeding experience prior to this delivery?: Yes How long did the patient breastfeed?: 4 months  Feeding Mother's Current Feeding Choice: Breast Milk Nipple Type: Slow - flow  LATCH Score                    Lactation Tools Discussed/Used Tools: Pump;Flanges Flange Size: 27 Breast pump type: Double-Electric Breast Pump;Manual Pump Education: Setup, frequency, and cleaning;Milk Storage Reason for Pumping: stimulation; weight loss Pumping frequency: q 2-3hrs  Interventions Interventions: Breast feeding basics  reviewed;Education;DEBP  Discharge Discharge Education: Engorgement and breast care;Outpatient recommendation;Warning signs for feeding baby Pump: Manual  Consult Status Consult Status: Complete Date: 05/25/20 Follow-up type: Call as needed    Danford Bad 05/25/2020, 9:47 AM

## 2020-05-25 NOTE — Discharge Summary (Signed)
      Patient Name: Darlene Ellis DOB: [birth year] MRN: [id number]                            Discharge Summary  Date of Admission: 05/23/2020 Date of Discharge: 05/25/2020 Delivering Provider: Linzie Collin   Admitting Diagnosis: Labor and delivery, indication for care [O75.9] at [redacted]w[redacted]d Secondary diagnosis:  Active Problems:   Labor and delivery, indication for care   Advanced labor Mode of Delivery: normal spontaneous vaginal delivery              Discharge diagnosis: Term Pregnancy Delivered        GBS Positive  Intrapartum Procedures: Atificial rupture of membranes and laceration 1st   Post partum procedures:   Complications: none              Edinburgh Score: Edinburgh Postnatal Depression Scale Screening Tool 05/23/2020  I have been able to laugh and see the funny side of things. 0  I have looked forward with enjoyment to things. 0  I have blamed myself unnecessarily when things went wrong. 1  I have been anxious or worried for no good reason. 0  I have felt scared or panicky for no good reason. 0  Things have been getting on top of me. 1  I have been so unhappy that I have had difficulty sleeping. 0  I have felt sad or miserable. 0  I have been so unhappy that I have been crying. 1  The thought of harming myself has occurred to me. 0  Edinburgh Postnatal Depression Scale Total 3     Discharge Instructions: Per After Visit Summary. Activity: Advance as tolerated. Pelvic rest for 6 weeks.  Also refer to After Visit Summary Diet: Regular Medications: Allergies as of 05/25/2020   No Known Allergies     Medication List    TAKE these medications   calcium carbonate 500 MG chewable tablet Commonly known as: TUMS - dosed in mg elemental calcium Chew 1 tablet by mouth daily.   multivitamin-prenatal 27-0.8 MG Tabs tablet Take 1 tablet by mouth daily at 12 noon.   pantoprazole 20 MG tablet Commonly known as: Protonix Take 1 tablet (20 mg total) by mouth daily.       Outpatient follow up:   Follow-up Information    Linzie Collin, MD Follow up in 4 week(s).   Specialties: Obstetrics and Gynecology, Radiology Contact information: [address] [place] [address] [phone]              Postpartum contraception: Will discuss at first office visit post-partum  Discharged Condition: good  Discharged to: home  Newborn Data: Disposition:home with mother  Apgars: APGAR (1 MIN): 9   APGAR (5 MINS): 9   APGAR (10 MINS):    Baby Feeding: Breast    Elonda Husky, M.D. 05/25/2020 10:48 AM

## 2020-05-25 NOTE — Discharge Instructions (Signed)
Postpartum Care After Vaginal Delivery The following information offers guidance about how to care for yourself from the time you deliver your baby to 6-12 weeks after delivery (postpartum period). If you have problems or questions, contact your health care provider for more specific instructions. Follow these instructions at home: Vaginal bleeding  It is normal to have vaginal bleeding (lochia) after delivery. Wear a sanitary pad for bleeding and discharge. ? During the first week after delivery, the amount and appearance of lochia is often similar to a menstrual period. ? Over the next few weeks, it will gradually decrease to a dry, yellow-brown discharge. ? For most women, lochia stops completely by 4-6 weeks after delivery, but can vary.  Change your sanitary pads frequently. Watch for any changes in your flow, such as: ? A sudden increase in volume. ? A change in color. ? Large blood clots.  If you pass a blood clot from your vagina, save it and call your health care provider. Do not flush blood clots down the toilet before talking with your health care provider.  Do not use tampons or douches until your health care provider approves.  If you are not breastfeeding, your period should return 6-8 weeks after delivery. If you are feeding your baby breast milk only, your period may not return until you stop breastfeeding. Perineal care  Keep the area between the vagina and the anus (perineum) clean and dry. Use medicated pads and pain-relieving sprays and creams as directed.  If you had a surgical cut in the perineum (episiotomy) or a tear, check the area for signs of infection until you are healed. Check for: ? More redness, swelling, or pain. ? Fluid or blood coming from the cut or tear. ? Warmth. ? Pus or a bad smell.  You may be given a squirt bottle to use instead of wiping to clean the perineum area after you use the bathroom. Pat the area gently to dry it.  To relieve pain  caused by an episiotomy, a tear, or swollen veins in the anus (hemorrhoids), take a warm sitz bath 2-3 times a day. In a sitz bath, the warm water should only come up to your hips and cover your buttocks.   Breast care  In the first few days after delivery, your breasts may feel heavy, full, and uncomfortable (breast engorgement). Milk may also leak from your breasts. Ask your health care provider about ways to help relieve the discomfort.  If you are breastfeeding: ? Wear a bra that supports your breasts and fits well. Use breast pads to absorb milk that leaks. ? Keep your nipples clean and dry. Apply creams and ointments as told. ? You may have uterine contractions every time you breastfeed for up to several weeks after delivery. This helps your uterus return to its normal size. ? If you have any problems with breastfeeding, notify your health care provider or lactation consultant.  If you are not breastfeeding: ? Avoid touching your breasts. Do not squeeze out (express) milk. Doing this can make your breasts produce more milk. ? Wear a good-fitting bra and use cold packs to help with swelling. Intimacy and sexuality  Ask your health care provider when you can engage in sexual activity. This may depend upon: ? Your risk of infection. ? How fast you are healing. ? Your comfort and desire to engage in sexual activity.  You are able to get pregnant after delivery, even if you have not had your period. Talk with   your health care provider about methods of birth control (contraception) or family planning if you desire future pregnancies. Medicines  Take over-the-counter and prescription medicines only as told by your health care provider.  Take an over-the-counter stool softener to help ease bowel movements as told by your health care provider.  If you were prescribed an antibiotic medicine, take it as told by your health care provider. Do not stop taking the antibiotic even if you start to  feel better.  Review all previous and current prescriptions to check for possible transfer into breast milk. Activity  Gradually return to your normal activities as told by your health care provider.  Rest as much as possible. Nap while your baby is sleeping. Eating and drinking  Drink enough fluid to keep your urine pale yellow.  To help prevent or relieve constipation, eat high-fiber foods every day.  Choose healthy eating to support breastfeeding or weight loss goals.  Take your prenatal vitamins until your health care provider tells you to stop.   General tips/recommendations  Do not use any products that contain nicotine or tobacco. These products include cigarettes, chewing tobacco, and vaping devices, such as e-cigarettes. If you need help quitting, ask your health care provider.  Do not drink alcohol, especially if you are breastfeeding.  Do not take medications or drugs that are not prescribed to you, especially if you are breastfeeding.  Visit your health care provider for a postpartum checkup within the first 3-6 weeks after delivery.  Complete a comprehensive postpartum visit no later than 12 weeks after delivery.  Keep all follow-up visits for you and your baby. Contact a health care provider if:  You feel unusually sad or worried.  Your breasts become red, painful, or hard.  You have a fever or other signs of an infection.  You have bleeding that is soaking through one pad an hour or you have blood clots.  You have a severe headache that doesn't go away or you have vision changes.  You have nausea and vomiting and are unable to eat or drink anything for 24 hours. Get help right away if:  You have chest pain or difficulty breathing.  You have sudden, severe leg pain.  You faint or have a seizure.  You have thoughts about hurting yourself or your baby. If you ever feel like you may hurt yourself or others, or have thoughts about taking your own life,  get help right away. Go to your nearest emergency department or:  Call your local emergency services (911 in the [place]  The National Suicide Prevention Lifeline at 1-800-273-8255. This suicide crisis helpline is open 24 hours a day.  Text the Crisis Text Line at 741741 (in the [place] Summary  The period of time after you deliver your newborn up to 6-12 weeks after delivery is called the postpartum period.  Keep all follow-up visits for you and your baby.  Review all previous and current prescriptions to check for possible transfer into breast milk.  Contact a health care provider if you feel unusually sad or worried during the postpartum period. This information is not intended to replace advice given to you by your health care provider. Make sure you discuss any questions you have with your health care provider. Document Revised: 10/01/2019 Document Reviewed: 10/01/2019 Elsevier Patient Education  2021 [employer] Inc[employer] Breastfeeding Tips for a Good Latch Latching is how your baby's mouth attaches to your nipple to breastfeed. It is an important part of breastfeeding. Your baby   may have trouble latching for a number of reasons, such as:  Not being in the right position.  Using a bottle or pacifier too early.  Problems within your baby's mouth, tongue, or lips.  The shape of your nipples.  Your baby being born early (prematurely). Small babies often have a weak suck.  Breasts becoming overfilled with milk (engorged breasts).  Express a little milk to help soften the breast. Work with a breastfeeding specialist (Science writer) to help your baby have a good latch. How does this affect me? A poor latch may cause you to have problems such as:  Cracked nipples.  Sore nipples.  Breasts becoming overfilled with milk  Plugged milk ducts.  Low milk supply.  Breast inflammation.  Breast infection. How does this affect my baby? A poor latch may cause your baby to not be  able to feed well. As a result, he or she may have trouble gaining weight. Follow these instructions at home: How to position your baby  Find a comfortable place to sit or lie down. Your neck and back should be well supported.  If you are seated, place a pillow or rolled-up blanket under your baby. This will bring him or her to the level of your breast.  Make sure that your baby's belly is facing your belly.  Try different positions to find one that works best for you and your baby. How to help your baby latch  To start, you might find it helpful to gently rub your breast. Move your fingertips in a circle as you massage from your chest wall toward your nipple. This helps milk flow. Keep doing this during feeding if needed.  Position your breast. Hold your breast with four fingers underneath and your thumb above your nipple. Keep your fingers away from your nipple and your baby's mouth. Follow these steps to help your baby latch: 1. Rub your baby's lips gently with your finger or nipple. 2. When your baby's mouth is open wide enough, quickly bring your baby to your breast and place your whole nipple into your baby's mouth. Place as much of the colored area around your nipple (areola)as possible into your baby's mouth. 3. Your baby's tongue should be between his or her lower gum and your breast. 4. You should be able to see more areola above your baby's upper lip than below the lower lip. 5. When your baby starts sucking, you will feel a gentle pull on your nipple. You should not feel any pain. Be patient. It is common for a baby to suck for about 2-3 minutes to start the flow of breast milk. 6. Make sure that your baby's mouth is in the right position around your nipple. Your baby's lips should make a seal on your breast and be turned outward.   General instructions  Look for these signs that your baby has latched on to your nipple: ? The baby is quietly tugging or sucking without causing  you pain. ? You hear the baby swallow after every 3 or 4 sucks. ? You see movement above and in front of the baby's ears while he or she is sucking.  Be aware of these signs that your baby has not latched on to your nipple: ? The baby makes sucking sounds or smacking sounds while feeding. ? You have nipple pain.  If your baby is not latched well, put your little finger between your baby's gums and your nipple. This will break the seal. Then try  to help your baby latch again.  If you need help, get help from a breastfeeding specialist. Contact a doctor if:  You have cracking or soreness in your nipples that lasts longer than 1 week.  You have nipple pain.  Your breasts are filled with too much milk (engorgement), and this does not improve after 48-72 hours.  You have a plugged milk duct and a fever.  You follow the tips for a good latch but need more help.  You have a pus-like fluid coming from your breast.  Your baby is not gaining weight.  Your baby loses weight. Summary  Latching is how your baby's mouth attaches to your nipple to breastfeed.  Try different positions for breastfeeding to find one that works best for you and your baby.  A poor latch may cause you to have cracked or sore nipples or other problems.  Work with a breastfeeding specialist (Advertising copywriter) to help your baby have a good latch. This information is not intended to replace advice given to you by your health care provider. Make sure you discuss any questions you have with your health care provider. Document Revised: 07/15/2019 Document Reviewed: 07/15/2019 Elsevier Patient Education  2021 Elsevier Inc. Breast Pumping Tips Breast pumping is a way to get milk out of your breasts. You will then store the milk for your baby to use when you are away from home. There are three ways to pump.  You can use your hand to massage and squeeze your breast (hand expression).  You can use a hand-held  machine to manually pump your milk.  You can use an electric machine to pump your milk. In the beginning you may not get much milk. After a few days, your breasts should make more. Pumping can help you start making milk after your baby is born. Pumping helps you to keep making milk when you are away from your baby. When should I pump? You can start pumping soon after your baby is born. Follow these tips:  When you are with your baby: ? Pump after you breastfeed. ? Pump from the free breast while you breastfeed.  When you are away from your baby: ? Pump every 2-3 hours for 15 minutes. ? Pump both breasts at the same time if you can.  If your baby drinks formula, pump around the time your baby gets the formula.  If you drank alcohol, wait 2 hours before you pump.  If you are going to have surgery, ask your doctor when you should pump again. How do I get ready to pump? Try to relax. Try these things to help your milk come in:  Smell your baby's blanket or clothes.  Look at a picture or video of your baby.  Sit in a quiet, private space.  Place a cloth on your breast. The cloth should be warm and a little wet.  Massage your breast and nipple.  Play relaxing music.  Picture your milk flowing.  Drink water and eat a snack. What are some tips? General tips for pumping breast milk  Always wash your hands with soap and water for at least 20 seconds before pumping.  If you do not get much milk or if pumping hurts, try different pump settings or a different kind of pump.  Drink enough fluid so your pee (urine) is clear or pale yellow.  Wear clothing that opens in the front or is easy to take off.  Pump milk into a clean bottle or  container.  Do not smoke or use any products that contain nicotine or tobacco. If you need help quitting, ask your doctor.  Try to get a hands-free pumping bra, if possible. This makes it easy to pump breast milk. You can buy one or make your own.    Tips for storing breast milk  Store breast milk in a clean, BPA-free container. These include: ? A glass or plastic bottle. ? A milk storage bag.  Store only 2-4 ounces of breast milk in each container.  Swirl the breast milk in the container. Do not shake it.  Write down the date you pumped the milk on the container.  This is how long you can store breast milk: ? Room temperature: 6-8 hours. It is best to use the milk within 4 hours. ? Cooler with ice packs: 24 hours. ? Refrigerator: 5-8 days, if the milk is clean. It is best to use the milk within 3 days. ? Freezer: 9-12 months, if the milk is clean and stored away from the freezer door. It is best to use the milk within 6 months.  Put milk in the back of the refrigerator or freezer.  Thaw frozen milk using warm water. Do not use the microwave.   Tips for choosing a breast pump When choosing a pump, keep the following things in mind:  Manual breast pumps do not need electricity. They cost less. They can be hard to use.  Electric breast pumps use electricity. They are more expensive. They are easier to use. They collect more milk.  The suction cup (flange) should be the right size.  Before you buy the pump, check if your insurance will pay for it. Tips for caring for a breast pump  Check the manual that came with your pump for cleaning tips.  Try not to touch the inside of pump parts.  Clean the pump after you use it. To do this: ? Wipe down the electrical part. Use a dry cloth or paper towel. Do not put this part in water or in cleaning products. ? Wash the plastic parts with soap and warm water. Or use the dishwasher if the manual says it is safe. You do not need to clean the tubing unless it touched breast milk. ? Let all the parts air dry. Avoid drying them with a cloth or towel. ? When the parts are clean and dry, put the pump back together. Then store the pump.  If there is water in the tubing when you want to  pump: 1. Attach the tubing to the pump. 2. Turn on the pump to dry the tubing. 3. Turn off the pump when the tube is dry. Summary  Pumping can help you start making milk after your baby is born. It lets you keep making milk when you are away from your baby.  When you are away from your baby, pump for about 15 minutes every 2-3 hours. Pump both breasts at the same time, if you can. This information is not intended to replace advice given to you by your health care provider. Make sure you discuss any questions you have with your health care provider. Document Revised: 10/27/2019 Document Reviewed: 10/27/2019 Elsevier Patient Education  2021 [employer].

## 2020-05-26 ENCOUNTER — Encounter: Payer: BC Managed Care – PPO | Admitting: Obstetrics and Gynecology

## 2020-05-31 ENCOUNTER — Ambulatory Visit: Payer: BC Managed Care – PPO

## 2020-06-02 ENCOUNTER — Encounter: Payer: BC Managed Care – PPO | Admitting: Obstetrics and Gynecology

## 2020-06-03 ENCOUNTER — Encounter (HOSPITAL_COMMUNITY): Payer: Self-pay

## 2020-06-03 ENCOUNTER — Other Ambulatory Visit: Payer: Self-pay

## 2020-06-03 ENCOUNTER — Inpatient Hospital Stay (HOSPITAL_COMMUNITY)
Admission: AD | Admit: 2020-06-03 | Discharge: 2020-06-03 | Disposition: A | Discharge status: Home / Self Care | Payer: BC Managed Care – PPO | Attending: Obstetrics and Gynecology | Admitting: Obstetrics and Gynecology

## 2020-06-03 DIAGNOSIS — R1030 Lower abdominal pain, unspecified: Secondary | ICD-10-CM | POA: Diagnosis not present

## 2020-06-03 DIAGNOSIS — O9089 Other complications of the puerperium, not elsewhere classified: Secondary | ICD-10-CM | POA: Insufficient documentation

## 2020-06-03 DIAGNOSIS — Z87891 Personal history of nicotine dependence: Secondary | ICD-10-CM | POA: Diagnosis not present

## 2020-06-03 DIAGNOSIS — O99893 Other specified diseases and conditions complicating puerperium: Secondary | ICD-10-CM | POA: Diagnosis not present

## 2020-06-03 DIAGNOSIS — N949 Unspecified condition associated with female genital organs and menstrual cycle: Secondary | ICD-10-CM

## 2020-06-03 DIAGNOSIS — R1031 Right lower quadrant pain: Secondary | ICD-10-CM | POA: Diagnosis not present

## 2020-06-03 LAB — COMPREHENSIVE METABOLIC PANEL
ALT: 32 U/L (ref 0–44)
AST: 25 U/L (ref 15–41)
Albumin: 3.6 g/dL (ref 3.5–5.0)
Alkaline Phosphatase: 120 U/L (ref 38–126)
Anion gap: 9 (ref 5–15)
BUN: 10 mg/dL (ref 6–20)
CO2: 24 mmol/L (ref 22–32)
Calcium: 9.5 mg/dL (ref 8.9–10.3)
Chloride: 103 mmol/L (ref 98–111)
Creatinine, Ser: 0.55 mg/dL (ref 0.44–1.00)
GFR, Estimated: >60 mL/min (ref >60)
Glucose, Bld: 99 mg/dL (ref 70–99)
Potassium: 3.9 mmol/L (ref 3.5–5.1)
Sodium: 136 mmol/L (ref 135–145)
Total Bilirubin: 1.5 mg/dL — ABNORMAL HIGH (ref 0.3–1.2)
Total Protein: 7 g/dL (ref 6.5–8.1)

## 2020-06-03 LAB — CBC
HCT: 45.7 % (ref 36.0–46.0)
Hemoglobin: 15 g/dL (ref 12.0–15.0)
MCH: 29.9 pg (ref 26.0–34.0)
MCHC: 32.8 g/dL (ref 30.0–36.0)
MCV: 91.2 fL (ref 80.0–100.0)
Platelets: 208 10*3/uL (ref 150–400)
RBC: 5.01 MIL/uL (ref 3.87–5.11)
RDW: 13.1 % (ref 11.5–15.5)
WBC: 7.3 10*3/uL (ref 4.0–10.5)
nRBC: 0 % (ref 0.0–0.2)

## 2020-06-03 LAB — URINALYSIS, ROUTINE W REFLEX MICROSCOPIC
Bilirubin Urine: NEGATIVE
Glucose, UA: NEGATIVE mg/dL
Ketones, ur: NEGATIVE mg/dL
Nitrite: NEGATIVE
Protein, ur: NEGATIVE mg/dL
Specific Gravity, Urine: 1.004 — ABNORMAL LOW (ref 1.005–1.030)
pH: 6 (ref 5.0–8.0)

## 2020-06-03 LAB — I-STAT BETA HCG BLOOD, ED (NOT ORDERABLE): I-stat hCG, quantitative: 17.7 m[IU]/mL — ABNORMAL HIGH (ref <5)

## 2020-06-03 LAB — LIPASE, BLOOD: Lipase: 32 U/L (ref 11–51)

## 2020-06-03 NOTE — MAU Note (Signed)
Pt had SVD on 05/23/20. Today after breastfeeding and pumping, went to [facility] and then had sudden pain in RLQ of abdomen. Pain has subsided some now. Still have mild pain RLQ that radiates around to R lower back. Still have some bleeding with occ small clots but no concerns there. Came in due to pain. Movement made it worse earlier but now is somewhat better.

## 2020-06-03 NOTE — ED Triage Notes (Signed)
RLQ pain started today while waking up. Denies any fever, chills, painful urination, vomiting and diarrhea.   Pt gave birth a week ago - NSVD.

## 2020-06-03 NOTE — Discharge Instructions (Signed)

## 2020-06-03 NOTE — Progress Notes (Signed)
Written and verbal d/c instructions given and understanding voiced. Has few more questions for provider so Wynelle Bourgeois CNM back in to talk with pt before d/c home

## 2020-06-03 NOTE — ED Provider Notes (Signed)
[facility] EMERGENCY DEPARTMENT Provider Note   CSN: [id number] Arrival date & time: 06/03/20  0433     History Chief Complaint  Patient presents with  . Abdominal Pain    Darlene Ellis is a 35 y.o. female.  Patient who is s/p vaginal delivery on 4/26 presents to the ED with a chief complaint of lower abdominal pain.  She states that the pain started today.  She reports associated chills but denies fever.  She denies any n/v/d.  Denies dysuria.  Worsened with palpation.  She states she is still passing blood clots.  The history is provided by the patient. No language interpreter was used.       Past Medical History:  Diagnosis Date  . Endometrial polyp   . UTI (lower urinary tract infection)   . Yeast infection     Patient Active Problem List   Diagnosis Date Noted  . Labor and delivery, indication for care 05/23/2020  . URI (upper respiratory infection) 03/13/2017  . Labor and delivery indication for care or intervention 01/06/2017  . Urinary urgency 01/03/2017  . Gastroesophageal reflux in pregnancy 10/03/2016  . Pregnancy 09/21/2016  . Skin cyst 09/21/2016  . Right upper quadrant pain 09/21/2016  . PCO (polycystic ovaries) 10/18/2015    Past Surgical History:  Procedure Laterality Date  . DILATION AND CURETTAGE OF UTERUS       OB History    Gravida  2   Para  2   Term  2   Preterm  0   AB  0   Living  2     SAB  0   IAB  0   Ectopic  0   Multiple  0   Live Births  2           Family History  Problem Relation Age of Onset  . Hypertension Father   . Ovarian cancer Maternal Aunt   . Colon cancer Maternal Aunt   . Diabetes Paternal Grandmother   . Breast cancer Neg Hx     Social History   Tobacco Use  . Smoking status: Former Smoker    Years: 11.00  . Smokeless tobacco: Never Used  Vaping Use  . Vaping Use: Never used  Substance Use Topics  . Alcohol use: No    Comment: occas  . Drug use: No     Home Medications Prior to Admission medications   Medication Sig Start Date End Date Taking? Authorizing Provider  calcium carbonate (TUMS - DOSED IN MG ELEMENTAL CALCIUM) 500 MG chewable tablet Chew 1 tablet by mouth daily.    [provider]  pantoprazole (PROTONIX) 20 MG tablet Take 1 tablet (20 mg total) by mouth daily. 05/19/20   Hildred Laser, MD  Prenatal Vit-Fe Fumarate-FA (MULTIVITAMIN-PRENATAL) 27-0.8 MG TABS tablet Take 1 tablet by mouth daily at 12 noon.    [provider]    Allergies    Patient has no known allergies.  Review of Systems   Review of Systems  All other systems reviewed and are negative.   Physical Exam Updated Vital Signs BP (!) 153/100 (BP Location: Right Arm)   Pulse 85   Temp 98.1 F (36.7 C) (Oral)   Resp 16   Ht 4\' 11"  (1.499 m)   Wt 68.9 kg   SpO2 97%   BMI 30.68 kg/m   Physical Exam Vitals and nursing note reviewed.  Constitutional:      General: She is not  in acute distress.    Appearance: She is well-developed.  HENT:     Head: Normocephalic and atraumatic.  Eyes:     Conjunctiva/sclera: Conjunctivae normal.  Cardiovascular:     Rate and Rhythm: Normal rate.     Heart sounds: No murmur heard.   Pulmonary:     Effort: Pulmonary effort is normal. No respiratory distress.  Abdominal:     General: There is no distension.     Tenderness: There is abdominal tenderness.     Comments: Generalized lower abdominal tenderness  Musculoskeletal:     Cervical back: Neck supple.     Comments: Moves all extremities  Skin:    General: Skin is warm and dry.  Neurological:     Mental Status: She is alert and oriented to person, place, and time.  Psychiatric:        Mood and Affect: Mood normal.        Behavior: Behavior normal.     ED Results / Procedures / Treatments   Labs (all labs ordered are listed, but only abnormal results are displayed) Labs Reviewed  LIPASE, BLOOD  COMPREHENSIVE METABOLIC PANEL  CBC   URINALYSIS, ROUTINE W REFLEX MICROSCOPIC  I-STAT BETA HCG BLOOD, ED ([facility], [facility], [facility] ONLY)    EKG None  Radiology No results found.  Procedures Procedures   Medications Ordered in ED Medications - No data to display  ED Course  I have reviewed the triage vital signs and the nursing notes.  Pertinent labs & imaging results that were available during my care of the patient were reviewed by me and considered in my medical decision making (see chart for details).    MDM Rules/Calculators/A&P                          Patient with lower abdominal pain that started today.  She is 10 days postpartum.  She has been having bleeding, but denies discharge.  Denies dysuria.  Noted to be hypertensive in triage.  Discussed case with MAU APP, who accepts in transfer. Final Clinical Impression(s) / ED Diagnoses Final diagnoses:  Lower abdominal pain    Rx / DC Orders ED Discharge Orders    None       Roxy Horseman, PA-C 06/03/20 [id number]    Maia Plan, MD 06/03/20 [phone]

## 2020-06-03 NOTE — MAU Provider Note (Signed)
Chief Complaint:  Abdominal Pain and Vaginal Bleeding   Event Date/Time   First Provider Initiated Contact with Patient 06/03/20 0548      HPI: Darlene Ellis is a 35 y.o. [id number] who is 11 days PP  presents to maternity admissions reporting Episode of sharp RLQ pain which occurred after voiding while walking back to bed.  Lasted a few minutes and resolved.  Now only a "1".. She reports vaginal bleeding, but no vaginal itching/burning, urinary symptoms, h/a, dizziness, n/v, or fever/chills.   Had a fast SVD with no complications.   Abdominal Pain This is a new problem. The current episode started today. The onset quality is sudden. The problem occurs rarely. The problem has been resolved. The pain is located in the RLQ. The quality of the pain is sharp. The abdominal pain does not radiate. Pertinent negatives include no constipation, diarrhea, fever, myalgias or nausea. The pain is aggravated by movement. The pain is relieved by nothing. She has tried nothing for the symptoms.   RN Note: RLQ pain started today while waking up. Denies any fever, chills, painful urination, vomiting and diarrhea.  Pt gave birth a week ago - NSVD.  Past Medical History: Past Medical History:  Diagnosis Date  . Endometrial polyp   . UTI (lower urinary tract infection)   . Yeast infection     Past obstetric history: OB History  Gravida Para Term Preterm AB Living  2 2 2  0 0 2  SAB IAB Ectopic Multiple Live Births  0 0 0 0 2    # Outcome Date GA Lbr Len/2nd Weight Sex Delivery Anes PTL Lv  2 Term 05/23/20 [redacted]w[redacted]d  3410 g F Vag-Spont None  LIV  1 Term 01/07/17 [redacted]w[redacted]d 04:06 / 01:15 3150 g M Vag-Vacuum EPI  LIV    Past Surgical History: Past Surgical History:  Procedure Laterality Date  . DILATION AND CURETTAGE OF UTERUS      Family History: Family History  Problem Relation Age of Onset  . Hypertension Father   . Ovarian cancer Maternal Aunt   . Colon cancer Maternal Aunt   . Diabetes  Paternal Grandmother   . Breast cancer Neg Hx     Social History: Social History   Tobacco Use  . Smoking status: Former Smoker    Years: 11.00  . Smokeless tobacco: Never Used  Vaping Use  . Vaping Use: Never used  Substance Use Topics  . Alcohol use: No    Comment: occas  . Drug use: No    Allergies: No Known Allergies  Meds:  Medications Prior to Admission  Medication Sig Dispense Refill Last Dose  . Prenatal Vit-Fe Fumarate-FA (MULTIVITAMIN-PRENATAL) 27-0.8 MG TABS tablet Take 1 tablet by mouth daily at 12 noon.   06/02/2020 at Unknown time  . calcium carbonate (TUMS - DOSED IN MG ELEMENTAL CALCIUM) 500 MG chewable tablet Chew 1 tablet by mouth daily.     . pantoprazole (PROTONIX) 20 MG tablet Take 1 tablet (20 mg total) by mouth daily. 60 tablet 1     I have reviewed patient's Past Medical Hx, Surgical Hx, Family Hx, Social Hx, medications and allergies.  ROS:  Review of Systems  Constitutional: Positive for chills (sometimes). Negative for appetite change and fever.  Respiratory: Negative for shortness of breath.   Cardiovascular: Negative for leg swelling.  Gastrointestinal: Positive for abdominal pain. Negative for constipation, diarrhea and nausea.  Genitourinary: Positive for vaginal bleeding.  Musculoskeletal: Negative for myalgias.  Neurological:  Negative for dizziness, weakness and numbness.   Other systems negative     Physical Exam   Patient Vitals for the past 24 hrs:  BP Temp Temp src Pulse Resp SpO2 Height Weight  06/03/20 0530 123/82 -- -- 69 -- -- -- --  06/03/20 0529 -- 98 F (36.7 C) -- -- 16 -- 4\' 11"  (1.499 m) 63.5 kg  06/03/20 0441 (!) 153/100 98.1 F (36.7 C) Oral 85 16 97 % -- --  06/03/20 0441 -- -- -- -- -- -- 4\' 11"  (1.499 m) 68.9 kg   No hypertension during pregnancy.  The single elevation was first triage BP in ED before being transferred to [facility]   Suspect it was a spurious value  Constitutional: Well-developed, well-nourished  female in no acute distress.  Cardiovascular: normal rate and rhythm Respiratory: normal effort, no distress. GI: Abd soft, non-tender except mildly tender over RLQ near ligament..  Nondistended.  No rebound, No guarding.  Bowel Sounds audible  MS: Extremities nontender, no edema, normal ROM Neurologic: Alert and oriented x 4.   Grossly nonfocal. GU: Neg CVAT. Skin:  Warm and Dry Psych:  Affect appropriate.  PELVIC EXAM: Deferred. Lochia small   Labs: Results for orders placed or performed during the hospital encounter of 06/03/20 (from the past 24 hour(s))  Urinalysis, Routine w reflex microscopic Urine, Clean Catch     Status: Abnormal   Collection Time: 06/03/20  4:44 AM  Result Value Ref Range   Color, Urine YELLOW YELLOW   APPearance CLEAR CLEAR   Specific Gravity, Urine 1.004 (L) 1.005 - 1.030   pH 6.0 5.0 - 8.0   Glucose, UA NEGATIVE NEGATIVE mg/dL   Hgb urine dipstick LARGE (A) NEGATIVE   Bilirubin Urine NEGATIVE NEGATIVE   Ketones, ur NEGATIVE NEGATIVE mg/dL   Protein, ur NEGATIVE NEGATIVE mg/dL   Nitrite NEGATIVE NEGATIVE   Leukocytes,Ua MODERATE (A) NEGATIVE   RBC / HPF 11-20 0 - 5 RBC/hpf   WBC, UA 11-20 0 - 5 WBC/hpf   Bacteria, UA RARE (A) NONE SEEN  Lipase, blood     Status: None   Collection Time: 06/03/20  4:59 AM  Result Value Ref Range   Lipase 32 11 - 51 U/L  Comprehensive metabolic panel     Status: Abnormal   Collection Time: 06/03/20  4:59 AM  Result Value Ref Range   Sodium 136 135 - 145 mmol/L   Potassium 3.9 3.5 - 5.1 mmol/L   Chloride 103 98 - 111 mmol/L   CO2 24 22 - 32 mmol/L   Glucose, Bld 99 70 - 99 mg/dL   BUN 10 6 - 20 mg/dL   Creatinine, Ser 08/03/20 0.44 - 1.00 mg/dL   Calcium 9.5 8.9 - 08/03/20 mg/dL   Total Protein 7.0 6.5 - 8.1 g/dL   Albumin 3.6 3.5 - 5.0 g/dL   AST 25 15 - 41 U/L   ALT 32 0 - 44 U/L   Alkaline Phosphatase 120 38 - 126 U/L   Total Bilirubin 1.5 (H) 0.3 - 1.2 mg/dL   GFR, Estimated 6.97 94.8 mL/min   Anion gap 9 5 - 15   CBC     Status: None   Collection Time: 06/03/20  4:59 AM  Result Value Ref Range   WBC 7.3 4.0 - 10.5 K/uL   RBC 5.01 3.87 - 5.11 MIL/uL   Hemoglobin 15.0 12.0 - 15.0 g/dL   HCT >65 08/03/20 - 53.7 %   MCV 91.2 80.0 - 100.0  fL   MCH 29.9 26.0 - 34.0 pg   MCHC 32.8 30.0 - 36.0 g/dL   RDW 56.8 12.7 - 51.7 %   Platelets 208 150 - 400 K/uL   nRBC 0.0 0.0 - 0.2 %  I-Stat beta hCG blood, ED     Status: Abnormal   Collection Time: 06/03/20  5:25 AM  Result Value Ref Range   I-stat hCG, quantitative 17.7 (H) <5 mIU/mL   Comment 3           --/--/A POS (04/25 0434)  Imaging:  No results found.  MAU Course/MDM: I have ordered labs as follows: ordered by ED: all WNL   No leukocytosis to suggest infection.  Urine WNL. Excellend hemoglobin. Exam is not consistent with endometritis. Imaging ordered: None Results reviewed. Discussed with patient that this was probably just a pulling of the round ligament.  Discussed the tissues remain weak and distended for a few weeks post delivery, probably just pulled.  No sign of hemorrhage or infection Has appt at [facility] in 2 weeks   Pt stable at time of discharge.  Assessment: 1. Lower abdominal pain   2.     11 days post vaginal delivery  Plan: Discharge home Recommend usual care Follow up with [facility] for PP care Encouraged to return here or to other Urgent Care/ED if she develops worsening of symptoms, increase in pain, fever, or other concerning symptoms.   Wynelle Bourgeois CNM, MSN Certified Nurse-Midwife 06/03/2020 5:48 AM

## 2020-06-10 ENCOUNTER — Telehealth: Payer: Self-pay | Admitting: Lactation Services

## 2020-06-10 NOTE — Telephone Encounter (Signed)
Pt left a message on lactation voice mail yesterday asking whether she could bring a rented breast pump from [facility] back 5/12 or 5/13.  I returned the call today and requested that she return the pump today.  She requests that I meet her at front entrance to pick up pump as she has two children and cannot come in, she will call when she leaves home and let LC know when she will be here.  I informed her that I would be down to pick up pump asap and please bring rental return form.

## 2020-06-21 ENCOUNTER — Ambulatory Visit: Payer: BC Managed Care – PPO

## 2020-07-04 ENCOUNTER — Ambulatory Visit (INDEPENDENT_AMBULATORY_CARE_PROVIDER_SITE_OTHER): Payer: 59 | Admitting: Obstetrics and Gynecology

## 2020-07-04 ENCOUNTER — Other Ambulatory Visit: Payer: Self-pay

## 2020-07-04 ENCOUNTER — Encounter: Payer: Self-pay | Admitting: Obstetrics and Gynecology

## 2020-07-04 DIAGNOSIS — Z3009 Encounter for other general counseling and advice on contraception: Secondary | ICD-10-CM

## 2020-07-04 NOTE — Progress Notes (Signed)
HPI:      [name] is a 35 y.o. [phone] who LMP was No LMP recorded (lmp unknown).  Subjective:   She presents today for her 6-week postpartum visit.  She reports that she is doing well.  Reports that she has not had a menstrual period yet.  She continues to breast-feed full-time. She has not yet resumed intercourse.  She states that she does not desire birth control at this time. (Her husband is on chemotherapeutic drugs for lung cancer and must use condoms)    Hx: The following portions of the patient's history were reviewed and updated as appropriate:             She  has a past medical history of Endometrial polyp, UTI (lower urinary tract infection), and Yeast infection. She does not have any pertinent problems on file. She  has a past surgical history that includes Dilation and curettage of uterus. Her family history includes Colon cancer in her maternal aunt; Diabetes in her paternal grandmother; Hypertension in her father; Ovarian cancer in her maternal aunt. She  reports that she has quit smoking. She quit after 11.00 years of use. She has never used smokeless tobacco. She reports that she does not drink alcohol and does not use drugs. She has a current medication list which includes the following prescription(s): multivitamin-prenatal. She has No Known Allergies.       Review of Systems:  Review of Systems  Constitutional: Denied constitutional symptoms, night sweats, recent illness, fatigue, fever, insomnia and weight loss.  Eyes: Denied eye symptoms, eye pain, photophobia, vision change and visual disturbance.  Ears/Nose/Throat/Neck: Denied ear, nose, throat or neck symptoms, hearing loss, nasal discharge, sinus congestion and sore throat.  Cardiovascular: Denied cardiovascular symptoms, arrhythmia, chest pain/pressure, edema, exercise intolerance, orthopnea and palpitations.  Respiratory: Denied pulmonary symptoms, asthma, pleuritic pain, productive sputum,  cough, dyspnea and wheezing.  Gastrointestinal: Denied, gastro-esophageal reflux, melena, nausea and vomiting.  Genitourinary: Denied genitourinary symptoms including symptomatic vaginal discharge, pelvic relaxation issues, and urinary complaints.  Musculoskeletal: Denied musculoskeletal symptoms, stiffness, swelling, muscle weakness and myalgia.  Dermatologic: Denied dermatology symptoms, rash and scar.  Neurologic: Denied neurology symptoms, dizziness, headache, neck pain and syncope.  Psychiatric: Denied psychiatric symptoms, anxiety and depression.  Endocrine: Denied endocrine symptoms including hot flashes and night sweats.   Meds:   Current Outpatient Medications on File Prior to Visit  Medication Sig Dispense Refill  . Prenatal Vit-Fe Fumarate-FA (MULTIVITAMIN-PRENATAL) 27-0.8 MG TABS tablet Take 1 tablet by mouth daily at 12 noon.     No current facility-administered medications on file prior to visit.       [facility] - 07/04/20 1018      Pregnancy Intention Screening   Does the patient want to become pregnant in the next year? No    Does the patient's partner want to become pregnant in the next year? No    Would the patient like to discuss contraceptive options today? Yes          The pregnancy intention screening data noted above was reviewed. Potential methods of contraception were discussed. The patient elected to proceed with Female Condom.     Objective:     Vitals:   07/04/20 1014  BP: 112/72  Pulse: 74   Filed Weights   07/04/20 1014  Weight: 137 lb 9.6 oz (62.4 kg)              Physical examination   Pelvic:   Vulva:  Normal appearance.  No lesions.  Vagina: No lesions or abnormalities noted.  Support: Normal pelvic support.  Urethra No masses tenderness or scarring.  Meatus Normal size without lesions or prolapse.  Cervix: Normal appearance.  No lesions.  Anus: Normal exam.  No lesions.  Perineum: Normal exam.  No lesions.        Bimanual    Uterus: Normal size.  Non-tender.  Mobile.  AV.  Adnexae: No masses.  Non-tender to palpation.  Cul-de-sac: Negative for abnormality.     Assessment:    [id number] Patient Active Problem List   Diagnosis Date Noted  . Labor and delivery, indication for care 05/23/2020  . URI (upper respiratory infection) 03/13/2017  . Labor and delivery indication for care or intervention 01/06/2017  . Urinary urgency 01/03/2017  . Gastroesophageal reflux in pregnancy 10/03/2016  . Pregnancy 09/21/2016  . Skin cyst 09/21/2016  . Right upper quadrant pain 09/21/2016  . PCO (polycystic ovaries) 10/18/2015     1. Postpartum care and examination immediately after delivery   2. Birth control counseling     Patient doing well postpartum-no issues.   Plan:            1.  May resume all normal activities.  2.  Follow-up in approximately 3 months for annual examination. Orders No orders of the defined types were placed in this encounter.   No orders of the defined types were placed in this encounter.     F/U  Return in about 3 months (around 10/04/2020) for Annual Physical.  Elonda Husky, M.D. 07/04/2020 10:31 AM

## 2020-09-20 ENCOUNTER — Encounter: Payer: BC Managed Care – PPO | Admitting: Obstetrics and Gynecology

## 2020-10-04 ENCOUNTER — Ambulatory Visit (INDEPENDENT_AMBULATORY_CARE_PROVIDER_SITE_OTHER): Payer: 59 | Admitting: Obstetrics and Gynecology

## 2020-10-04 ENCOUNTER — Encounter: Payer: Self-pay | Admitting: Obstetrics and Gynecology

## 2020-10-04 ENCOUNTER — Other Ambulatory Visit: Payer: Self-pay

## 2020-10-04 ENCOUNTER — Other Ambulatory Visit (HOSPITAL_COMMUNITY)
Admission: RE | Admit: 2020-10-04 | Discharge: 2020-10-04 | Disposition: A | Discharge status: Home / Self Care | Payer: 59 | Source: Ambulatory Visit | Attending: Obstetrics and Gynecology | Admitting: Obstetrics and Gynecology

## 2020-10-04 VITALS — BP 107/73 | HR 69 | Resp 16 | Ht 59.0 in | Wt 135.2 lb

## 2020-10-04 DIAGNOSIS — Z01419 Encounter for gynecological examination (general) (routine) without abnormal findings: Secondary | ICD-10-CM | POA: Diagnosis not present

## 2020-10-04 DIAGNOSIS — Z124 Encounter for screening for malignant neoplasm of cervix: Secondary | ICD-10-CM | POA: Diagnosis not present

## 2020-10-04 LAB — HM PAP SMEAR

## 2020-10-04 NOTE — Progress Notes (Signed)
HPI:      [name] is a 35 y.o. [phone] who LMP was No LMP recorded (lmp unknown).  Subjective:   She presents today for her annual examination.  She has no complaints.  She continues to breast-feed full-time.  She is not yet having regular menses.  She and her husband are using condoms for birth control. (He has a history of lung cancer and is on chemotherapy)    Hx: The following portions of the patient's history were reviewed and updated as appropriate:             She  has a past medical history of Endometrial polyp, UTI (lower urinary tract infection), and Yeast infection. She does not have any pertinent problems on file. She  has a past surgical history that includes Dilation and curettage of uterus. Her family history includes Colon cancer in her maternal aunt; Diabetes in her paternal grandmother; Hypertension in her father; Ovarian cancer in her maternal aunt. She  reports that she has quit smoking. She has never used smokeless tobacco. She reports that she does not drink alcohol and does not use drugs. She has a current medication list which includes the following prescription(s): multivitamin-prenatal. She has No Known Allergies.       Review of Systems:  Review of Systems  Constitutional: Denied constitutional symptoms, night sweats, recent illness, fatigue, fever, insomnia and weight loss.  Eyes: Denied eye symptoms, eye pain, photophobia, vision change and visual disturbance.  Ears/Nose/Throat/Neck: Denied ear, nose, throat or neck symptoms, hearing loss, nasal discharge, sinus congestion and sore throat.  Cardiovascular: Denied cardiovascular symptoms, arrhythmia, chest pain/pressure, edema, exercise intolerance, orthopnea and palpitations.  Respiratory: Denied pulmonary symptoms, asthma, pleuritic pain, productive sputum, cough, dyspnea and wheezing.  Gastrointestinal: Denied, gastro-esophageal reflux, melena, nausea and vomiting.  Genitourinary: Denied  genitourinary symptoms including symptomatic vaginal discharge, pelvic relaxation issues, and urinary complaints.  Musculoskeletal: Denied musculoskeletal symptoms, stiffness, swelling, muscle weakness and myalgia.  Dermatologic: Denied dermatology symptoms, rash and scar.  Neurologic: Denied neurology symptoms, dizziness, headache, neck pain and syncope.  Psychiatric: Denied psychiatric symptoms, anxiety and depression.  Endocrine: Denied endocrine symptoms including hot flashes and night sweats.   Meds:   Current Outpatient Medications on File Prior to Visit  Medication Sig Dispense Refill   Prenatal Vit-Fe Fumarate-FA (MULTIVITAMIN-PRENATAL) 27-0.8 MG TABS tablet Take 1 tablet by mouth daily at 12 noon.     No current facility-administered medications on file prior to visit.     [facility] - 10/04/20 1351       Pregnancy Intention Screening   Does the patient want to become pregnant in the next year? No    Does the patient's partner want to become pregnant in the next year? No    Would the patient like to discuss contraceptive options today? No      Contraception Wrap Up   Current Method Female Condom    End Method Female Condom    Contraception Counseling Provided No            The pregnancy intention screening data noted above was reviewed. Potential methods of contraception were discussed. The patient elected to proceed with Female Condom.    Objective:     Vitals:   10/04/20 1350  BP: 107/73  Pulse: 69  Resp: 16    Filed Weights   10/04/20 1350  Weight: 135 lb 3.2 oz (61.3 kg)  Physical examination General NAD, Conversant  HEENT Atraumatic; Op clear with mmm.  Normo-cephalic. Pupils reactive. Anicteric sclerae  Thyroid/Neck Smooth without nodularity or enlargement. Normal ROM.  Neck Supple.  Skin No rashes, lesions or ulceration. Normal palpated skin turgor. No nodularity.  Breasts: No masses or discharge.  Symmetric.  No axillary adenopathy.   Lungs: Clear to auscultation.No rales or wheezes. Normal Respiratory effort, no retractions.  Heart: NSR.  No murmurs or rubs appreciated. No periferal edema  Abdomen: Soft.  Non-tender.  No masses.  No HSM. No hernia  Extremities: Moves all appropriately.  Normal ROM for age. No lymphadenopathy.  Neuro: Oriented to PPT.  Normal mood. Normal affect.     Pelvic:   Vulva: Normal appearance.  No lesions.  Vagina: No lesions or abnormalities noted.  Support: Normal pelvic support.  Urethra No masses tenderness or scarring.  Meatus Normal size without lesions or prolapse.  Cervix: Normal appearance.  No lesions.  Anus: Normal exam.  No lesions.  Perineum: Normal exam.  No lesions.        Bimanual   Uterus: Normal size.  Non-tender.  Mobile.  AV.  Adnexae: No masses.  Non-tender to palpation.  Cul-de-sac: Negative for abnormality.     Assessment:    [id number] Patient Active Problem List   Diagnosis Date Noted   Labor and delivery, indication for care 05/23/2020   URI (upper respiratory infection) 03/13/2017   Labor and delivery indication for care or intervention 01/06/2017   Urinary urgency 01/03/2017   Gastroesophageal reflux in pregnancy 10/03/2016   Pregnancy 09/21/2016   Skin cyst 09/21/2016   Right upper quadrant pain 09/21/2016   PCO (polycystic ovaries) 10/18/2015     1. Well woman exam with routine gynecological exam   2. Cervical cancer screening     Normal exam   Plan:            1.  Basic Screening Recommendations The basic screening recommendations for asymptomatic women were discussed with the patient during her visit.  The age-appropriate recommendations were discussed with her and the rational for the tests reviewed.  When I am informed by the patient that another primary care physician has previously obtained the age-appropriate tests and they are up-to-date, only outstanding tests are ordered and referrals given as necessary.  Abnormal results of tests will  be discussed with her when all of her results are completed.  Routine preventative health maintenance measures emphasized: Exercise/Diet/Weight control, Tobacco Warnings, Alcohol/Substance use risks and Stress Management Pap Co-test performed -remote history of HPV but last Pap showed no cytologic abnormality. Orders No orders of the defined types were placed in this encounter.   No orders of the defined types were placed in this encounter.         F/U  Return in about 1 year (around 10/04/2021) for Annual Physical.  Elonda Husky, M.D. 10/04/2020 2:22 PM

## 2020-10-10 LAB — CYTOLOGY - PAP
Comment: NEGATIVE
Diagnosis: NEGATIVE
High risk HPV: NEGATIVE

## 2021-10-05 ENCOUNTER — Encounter: Payer: 59 | Admitting: Obstetrics and Gynecology

## 2021-10-27 ENCOUNTER — Encounter (HOSPITAL_COMMUNITY): Payer: Self-pay | Admitting: Emergency Medicine

## 2021-10-27 ENCOUNTER — Ambulatory Visit (HOSPITAL_COMMUNITY)
Admission: EM | Admit: 2021-10-27 | Discharge: 2021-10-27 | Disposition: A | Discharge status: Home / Self Care | Payer: 59 | Attending: Family Medicine | Admitting: Family Medicine

## 2021-10-27 DIAGNOSIS — R051 Acute cough: Secondary | ICD-10-CM | POA: Diagnosis not present

## 2021-10-27 DIAGNOSIS — R42 Dizziness and giddiness: Secondary | ICD-10-CM

## 2021-10-27 DIAGNOSIS — R519 Headache, unspecified: Secondary | ICD-10-CM | POA: Diagnosis not present

## 2021-10-27 LAB — CBC WITH DIFFERENTIAL/PLATELET
Abs Immature Granulocytes: 0.01 10*3/uL (ref 0.00–0.07)
Basophils Absolute: 0.1 10*3/uL (ref 0.0–0.1)
Basophils Relative: 1 %
Eosinophils Absolute: 0.4 10*3/uL (ref 0.0–0.5)
Eosinophils Relative: 8 %
HCT: 43.2 % (ref 36.0–46.0)
Hemoglobin: 14.6 g/dL (ref 12.0–15.0)
Immature Granulocytes: 0 %
Lymphocytes Relative: 39 %
Lymphs Abs: 2 10*3/uL (ref 0.7–4.0)
MCH: 30.7 pg (ref 26.0–34.0)
MCHC: 33.8 g/dL (ref 30.0–36.0)
MCV: 90.9 fL (ref 80.0–100.0)
Monocytes Absolute: 0.5 10*3/uL (ref 0.1–1.0)
Monocytes Relative: 10 %
Neutro Abs: 2.2 10*3/uL (ref 1.7–7.7)
Neutrophils Relative %: 42 %
Platelets: 245 10*3/uL (ref 150–400)
RBC: 4.75 MIL/uL (ref 3.87–5.11)
RDW: 12 % (ref 11.5–15.5)
WBC: 5.1 10*3/uL (ref 4.0–10.5)
nRBC: 0 % (ref 0.0–0.2)

## 2021-10-27 LAB — COMPREHENSIVE METABOLIC PANEL
ALT: 18 U/L (ref 0–44)
AST: 22 U/L (ref 15–41)
Albumin: 4.4 g/dL (ref 3.5–5.0)
Alkaline Phosphatase: 52 U/L (ref 38–126)
Anion gap: 9 (ref 5–15)
BUN: 7 mg/dL (ref 6–20)
CO2: 24 mmol/L (ref 22–32)
Calcium: 9.1 mg/dL (ref 8.9–10.3)
Chloride: 105 mmol/L (ref 98–111)
Creatinine, Ser: 0.65 mg/dL (ref 0.44–1.00)
GFR, Estimated: >60 mL/min (ref >60)
Glucose, Bld: 78 mg/dL (ref 70–99)
Potassium: 4.9 mmol/L (ref 3.5–5.1)
Sodium: 138 mmol/L (ref 135–145)
Total Bilirubin: 1.1 mg/dL (ref 0.3–1.2)
Total Protein: 7.7 g/dL (ref 6.5–8.1)

## 2021-10-27 NOTE — ED Triage Notes (Signed)
Pt reports a cough x 1 week, intermittent dizziness for a long time and more frequently the past week, and a mild headache for 1.5 weeks. States headache occurs before lunch time and lasts the whole day. Has been taking Tylenol for pain.

## 2021-10-27 NOTE — ED Provider Notes (Addendum)
[facility]    CSN: [id number] Arrival date & time: 10/27/21  0901      History   Chief Complaint Chief Complaint  Patient presents with   Cough   Dizziness   Headache    HPI Darlene Ellis is a 36 y.o. female.   Patient is here for a constant headache for over a week.  Also with dizziness.  She c/o dizziness off/on for a long time.  Happens every several weeks.   Pain is in the front/top of her head.  Some pressure in her face as well.  Can get 6-7/10 at times.  She lays down, rest.  This week she is having more dizziness, almost daily.   Light headedness.  She can walk/attend to her kids, but she knows she dizzy.  Cough mostly at night, also started last week.  At night has some runny nose, congestion.  Mostly PND.  She did try tylenol once and did not help.  No nausea/vomiting.  No light sensativity, no blurry vision.  She has not seen her pcp in a long time and has not discussed this.       Past Medical History:  Diagnosis Date   Endometrial polyp    UTI (lower urinary tract infection)    Yeast infection     Patient Active Problem List   Diagnosis Date Noted   Labor and delivery, indication for care 05/23/2020   URI (upper respiratory infection) 03/13/2017   Labor and delivery indication for care or intervention 01/06/2017   Urinary urgency 01/03/2017   Gastroesophageal reflux in pregnancy 10/03/2016   Pregnancy 09/21/2016   Skin cyst 09/21/2016   Right upper quadrant pain 09/21/2016   PCO (polycystic ovaries) 10/18/2015    Past Surgical History:  Procedure Laterality Date   DILATION AND CURETTAGE OF UTERUS      OB History     Gravida  2   Para  2   Term  2   Preterm  0   AB  0   Living  2      SAB  0   IAB  0   Ectopic  0   Multiple  0   Live Births  2            Home Medications    Prior to Admission medications   Medication Sig Start Date End Date Taking? Authorizing Provider  Prenatal Vit-Fe  Fumarate-FA (MULTIVITAMIN-PRENATAL) 27-0.8 MG TABS tablet Take 1 tablet by mouth daily at 12 noon.    [provider]    Family History Family History  Problem Relation Age of Onset   Hypertension Father    Ovarian cancer Maternal Aunt    Colon cancer Maternal Aunt    Diabetes Paternal Grandmother    Breast cancer Neg Hx     Social History Social History   Tobacco Use   Smoking status: Former    Years: 11.00    Types: Cigarettes   Smokeless tobacco: Never  Vaping Use   Vaping Use: Never used  Substance Use Topics   Alcohol use: No    Comment: occas   Drug use: No     Allergies   Patient has no known allergies.   Review of Systems Review of Systems  Constitutional: Negative.   HENT:  Positive for postnasal drip.   Respiratory:  Positive for cough.   Cardiovascular: Negative.   Gastrointestinal: Negative.   Genitourinary: Negative.   Musculoskeletal: Negative.   Neurological:  Positive for dizziness and headaches.  Psychiatric/Behavioral: Negative.       Physical Exam Triage Vital Signs ED Triage Vitals  Enc Vitals Group     BP 10/27/21 0957 122/77     Pulse Rate 10/27/21 0957 68     Resp 10/27/21 0957 18     Temp 10/27/21 0957 98.6 F (37 C)     Temp Source 10/27/21 0957 Oral     SpO2 10/27/21 0957 98 %     Weight --      Height --      Head Circumference --      Peak Flow --      Pain Score 10/27/21 0956 4     Pain Loc --      Pain Edu? --      Excl. in GC? --    No data found.  Updated Vital Signs BP 122/77 (BP Location: Left Arm)   Pulse 68   Temp 98.6 F (37 C) (Oral)   Resp 18   SpO2 98%   Visual Acuity Right Eye Distance:   Left Eye Distance:   Bilateral Distance:    Right Eye Near:   Left Eye Near:    Bilateral Near:     Physical Exam Constitutional:      General: She is not in acute distress.    Appearance: She is well-developed. She is not ill-appearing.  HENT:     Head: Normocephalic.     Mouth/Throat:      Mouth: Mucous membranes are moist.  Eyes:     Extraocular Movements: Extraocular movements intact.     Pupils: Pupils are equal, round, and reactive to light.  Cardiovascular:     Rate and Rhythm: Normal rate and regular rhythm.  Pulmonary:     Effort: Pulmonary effort is normal.     Breath sounds: Normal breath sounds.  Musculoskeletal:        General: Normal range of motion.     Cervical back: Normal range of motion and neck supple.  Lymphadenopathy:     Cervical: No cervical adenopathy.  Skin:    General: Skin is warm.  Neurological:     Mental Status: She is alert and oriented to person, place, and time.  Psychiatric:        Mood and Affect: Mood normal.      UC Treatments / Results  Labs (all labs ordered are listed, but only abnormal results are displayed) Labs Reviewed  CBC WITH DIFFERENTIAL/PLATELET  COMPREHENSIVE METABOLIC PANEL    EKG   Radiology No results found.  Procedures Procedures (including critical care time)  Medications Ordered in UC Medications - No data to display  Initial Impression / Assessment and Plan / UC Course  I have reviewed the triage vital signs and the nursing notes.  Pertinent labs & imaging results that were available during my care of the patient were reviewed by me and considered in my medical decision making (see chart for details).  Patient was seen today for headache, dizziness and cough.  Her exam was normal today and she looked very good.  Long discussion that there was little work up I could do in the urgent care today, but did decide to get basic lab work.    Advised that she follow up with a primary care provider for further discussion and work up of her issues.   If she has worsening symptoms then she could go to the ER for evaluation, but that was not  warranted today.    Final Clinical Impressions(s) / UC Diagnoses   Final diagnoses:  Acute cough  Daily headache  Dizziness     Discharge Instructions       You were seen today for various issues.  I have ordered blood work today.  This will be resulted later today and you will be notified if anything is abnormal.  I recommend over the counter zyrtec or claritin daily to see if helps with some of your symptoms.  You do need to follow up with a primary care provider for these issues as I cannot work them up fully here.  Please go to [email or web address] to find a provider.  If you develop severe headache, worsening dizziness, or nausea or vomiting then please go to the ER for evaluation.     ED Prescriptions   None    PDMP not reviewed this encounter.   Rondel Oh, MD 10/27/21 1039    Rondel Oh, MD 10/27/21 1053    Rondel Oh, MD 10/27/21 1108

## 2021-10-27 NOTE — Discharge Instructions (Addendum)
You were seen today for various issues.  I have ordered blood work today.  This will be resulted later today and you will be notified if anything is abnormal.  I recommend over the counter zyrtec or claritin daily to see if helps with some of your symptoms.  You do need to follow up with a primary care provider for these issues as I cannot work them up fully here.  Please go to [email or web address] to find a provider.  If you develop severe headache, worsening dizziness, or nausea or vomiting then please go to the ER for evaluation.

## 2022-01-30 ENCOUNTER — Encounter: Payer: Self-pay | Admitting: Obstetrics and Gynecology

## 2022-01-30 ENCOUNTER — Ambulatory Visit (INDEPENDENT_AMBULATORY_CARE_PROVIDER_SITE_OTHER): Payer: 59 | Admitting: Obstetrics and Gynecology

## 2022-01-30 DIAGNOSIS — Z01419 Encounter for gynecological examination (general) (routine) without abnormal findings: Secondary | ICD-10-CM

## 2022-01-30 DIAGNOSIS — Z30011 Encounter for initial prescription of contraceptive pills: Secondary | ICD-10-CM

## 2022-01-30 MED ORDER — DESOGESTREL-ETHINYL ESTRADIOL 0.15-0.02/0.01 MG (21/5) PO TABS: 1.0000 | ORAL_TABLET | Freq: Every day | ORAL | 3 refills | Status: DC

## 2022-01-30 NOTE — Progress Notes (Signed)
HPI:      [name] is a 37 y.o. [phone] who LMP was No LMP recorded.  Subjective:   She presents today for her annual examination.  She reports that she is having some spotting and also having a 2 to 3-day menstrual period each month.  She states she does not know when this is going to happen and is somewhat annoying.  She continues to use condoms for birth control (has been taking chemotherapeutic drugs for lung cancer) she also continues to breast-feed her daughter for comfort-Darlene Ellis says she is not making any milk. She would like something for cycle control. She also states her husband has noticed something different inside her vagina and would like to know if there is something wrong.  Reports them as bumps.    Hx: The following portions of the patient's history were reviewed and updated as appropriate:             She  has a past medical history of Endometrial polyp, UTI (lower urinary tract infection), and Yeast infection. She does not have any pertinent problems on file. She  has a past surgical history that includes Dilation and curettage of uterus. Her family history includes Colon cancer in her maternal aunt; Diabetes in her paternal grandmother; Hypertension in her father; Ovarian cancer in her maternal aunt. She  reports that she has quit smoking. Her smoking use included cigarettes. She has never used smokeless tobacco. She reports that she does not drink alcohol and does not use drugs. She currently has no medications in their medication list. She is allergic to other.       Review of Systems:  Review of Systems  Constitutional: Denied constitutional symptoms, night sweats, recent illness, fatigue, fever, insomnia and weight loss.  Eyes: Denied eye symptoms, eye pain, photophobia, vision change and visual disturbance.  Ears/Nose/Throat/Neck: Denied ear, nose, throat or neck symptoms, hearing loss, nasal discharge, sinus congestion and sore throat.   Cardiovascular: Denied cardiovascular symptoms, arrhythmia, chest pain/pressure, edema, exercise intolerance, orthopnea and palpitations.  Respiratory: Denied pulmonary symptoms, asthma, pleuritic pain, productive sputum, cough, dyspnea and wheezing.  Gastrointestinal: Denied, gastro-esophageal reflux, melena, nausea and vomiting.  Genitourinary: Denied genitourinary symptoms including symptomatic vaginal discharge, pelvic relaxation issues, and urinary complaints.  Musculoskeletal: Denied musculoskeletal symptoms, stiffness, swelling, muscle weakness and myalgia.  Dermatologic: Denied dermatology symptoms, rash and scar.  Neurologic: Denied neurology symptoms, dizziness, headache, neck pain and syncope.  Psychiatric: Denied psychiatric symptoms, anxiety and depression.  Endocrine: Denied endocrine symptoms including hot flashes and night sweats.   Meds:   No current outpatient medications on file prior to visit.   No current facility-administered medications on file prior to visit.     Objective:     There were no vitals filed for this visit.  There were no vitals filed for this visit.            Physical examination General NAD, Conversant  HEENT Atraumatic; Op clear with mmm.  Normo-cephalic. Pupils reactive. Anicteric sclerae  Thyroid/Neck Smooth without nodularity or enlargement. Normal ROM.  Neck Supple.  Skin No rashes, lesions or ulceration. Normal palpated skin turgor. No nodularity.  Breasts: No masses or discharge.  Symmetric.  No axillary adenopathy.  Lungs: Clear to auscultation.No rales or wheezes. Normal Respiratory effort, no retractions.  Heart: NSR.  No murmurs or rubs appreciated. No periferal edema  Abdomen: Soft.  Non-tender.  No masses.  No HSM. No hernia  Extremities: Moves all appropriately.  Normal ROM for age. No lymphadenopathy.  Neuro: Oriented to PPT.  Normal mood. Normal affect.     Pelvic:   Vulva: Normal appearance.  No lesions.  Vagina: No  lesions or abnormalities noted.  Support: Normal pelvic support.  Urethra No masses tenderness or scarring.  Meatus Normal size without lesions or prolapse.  Cervix: Normal appearance.  No lesions.  Anus: Normal exam.  No lesions.  Perineum: Normal exam.  No lesions.        Bimanual   Uterus: Normal size.  Non-tender.  Mobile.  AV.  Adnexae: No masses.  Non-tender to palpation.  Cul-de-sac: Negative for abnormality.     Assessment:    [id number] Patient Active Problem List   Diagnosis Date Noted   Labor and delivery, indication for care 05/23/2020   URI (upper respiratory infection) 03/13/2017   Labor and delivery indication for care or intervention 01/06/2017   Urinary urgency 01/03/2017   Gastroesophageal reflux in pregnancy 10/03/2016   Pregnancy 09/21/2016   Skin cyst 09/21/2016   Right upper quadrant pain 09/21/2016   PCO (polycystic ovaries) 10/18/2015     1. Well woman exam with routine gynecological exam   2. Initiation of OCP (BCP)     Normal exam-possible "bumps" are slight disruption of hymenal ring but this appears normal to me.  Patient would like to take OCPs for cycle control.   Plan:            1.  Basic Screening Recommendations The basic screening recommendations for asymptomatic women were discussed with the patient during her visit.  The age-appropriate recommendations were discussed with her and the rational for the tests reviewed.  When I am informed by the patient that another primary care physician has previously obtained the age-appropriate tests and they are up-to-date, only outstanding tests are ordered and referrals given as necessary.  Abnormal results of tests will be discussed with her when all of her results are completed.  Routine preventative health maintenance measures emphasized: Exercise/Diet/Weight control, Tobacco Warnings, Alcohol/Substance use risks and Stress Management 2.  Patient would like to start OCPs. OCPs The risks /benefits of  OCPs have been explained to the patient in detail.  Product literature has been given to her where appropriate.  I have instructed her in the use of OCPs.  I have explained to the patient that OCPs are not as effective for birth control during the first month of use, and that another form of contraception should be used during this time.  Both first-day start and Sunday start have been explained.  The risks and benefits of each was discussed.  She has been made aware of  the fact that in rare circumstances, other medications may affect the efficacy of OCPs.  I have answered all of her questions, and I believe that she has an understanding of the effectiveness and use of OCPs.  Orders Orders Placed This Encounter  Procedures   Basic metabolic panel   CBC   TSH   Lipid panel   Hemoglobin A1c    No orders of the defined types were placed in this encounter.         F/U  Return in about 1 year (around 01/31/2023).  Finis Bud, M.D. 01/30/2022 8:45 AM

## 2022-01-30 NOTE — Progress Notes (Signed)
Patients presents for annual exam today. She states she would like to discuss cycle control due to irregular menstrual period. Patient is up to date on pap smear. Annual labs are ordered. Patient states no other questions or concerns at this time.

## 2022-01-31 LAB — BASIC METABOLIC PANEL
BUN/Creatinine Ratio: 10 (ref 9–23)
BUN: 7 mg/dL (ref 6–20)
CO2: 23 mmol/L (ref 20–29)
Calcium: 9.4 mg/dL (ref 8.7–10.2)
Chloride: 100 mmol/L (ref 96–106)
Creatinine, Ser: 0.69 mg/dL (ref 0.57–1.00)
Glucose: 93 mg/dL (ref 70–99)
Potassium: 4.2 mmol/L (ref 3.5–5.2)
Sodium: 138 mmol/L (ref 134–144)
eGFR: 116 mL/min/{1.73_m2} (ref >59)

## 2022-01-31 LAB — LIPID PANEL
Chol/HDL Ratio: 3 ratio (ref 0.0–4.4)
Cholesterol, Total: 234 mg/dL — ABNORMAL HIGH (ref 100–199)
HDL: 77 mg/dL (ref >39)
LDL Chol Calc (NIH): 141 mg/dL — ABNORMAL HIGH (ref 0–99)
Triglycerides: 94 mg/dL (ref 0–149)
VLDL Cholesterol Cal: 16 mg/dL (ref 5–40)

## 2022-01-31 LAB — CBC
Hematocrit: 42.4 % (ref 34.0–46.6)
Hemoglobin: 13.7 g/dL (ref 11.1–15.9)
MCH: 28.4 pg (ref 26.6–33.0)
MCHC: 32.3 g/dL (ref 31.5–35.7)
MCV: 88 fL (ref 79–97)
Platelets: 238 10*3/uL (ref 150–450)
RBC: 4.82 x10E6/uL (ref 3.77–5.28)
RDW: 12.3 % (ref 11.7–15.4)
WBC: 4.9 10*3/uL (ref 3.4–10.8)

## 2022-01-31 LAB — HEMOGLOBIN A1C
Est. average glucose Bld gHb Est-mCnc: 108 mg/dL
Hgb A1c MFr Bld: 5.4 % (ref 4.8–5.6)

## 2022-01-31 LAB — TSH: TSH: 1.69 u[IU]/mL (ref 0.450–4.500)

## 2022-09-07 ENCOUNTER — Encounter: Payer: Self-pay | Admitting: Internal Medicine

## 2022-09-07 ENCOUNTER — Ambulatory Visit: Payer: 59 | Admitting: Internal Medicine

## 2022-09-07 ENCOUNTER — Telehealth: Payer: Self-pay | Admitting: Internal Medicine

## 2022-09-07 VITALS — BP 100/62 | HR 61 | Temp 98.1°F | Ht 59.0 in | Wt 130.2 lb

## 2022-09-07 DIAGNOSIS — R42 Dizziness and giddiness: Secondary | ICD-10-CM

## 2022-09-07 DIAGNOSIS — G4489 Other headache syndrome: Secondary | ICD-10-CM | POA: Diagnosis not present

## 2022-09-07 LAB — VITAMIN D 25 HYDROXY (VIT D DEFICIENCY, FRACTURES): VITD: 28.5 ng/mL — ABNORMAL LOW (ref 30.00–100.00)

## 2022-09-07 LAB — COMPREHENSIVE METABOLIC PANEL
ALT: 13 U/L (ref 0–35)
AST: 17 U/L (ref 0–37)
Albumin: 5.1 g/dL (ref 3.5–5.2)
Alkaline Phosphatase: 56 U/L (ref 39–117)
BUN: 8 mg/dL (ref 6–23)
CO2: 24 mEq/L (ref 19–32)
Calcium: 10.7 mg/dL — ABNORMAL HIGH (ref 8.4–10.5)
Chloride: 102 mEq/L (ref 96–112)
Creatinine, Ser: 0.77 mg/dL (ref 0.40–1.20)
GFR: 99.24 mL/min (ref >60.00)
Glucose, Bld: 97 mg/dL (ref 70–99)
Potassium: 5.2 mEq/L — ABNORMAL HIGH (ref 3.5–5.1)
Sodium: 142 mEq/L (ref 135–145)
Total Bilirubin: 1.4 mg/dL — ABNORMAL HIGH (ref 0.2–1.2)
Total Protein: 8 g/dL (ref 6.0–8.3)

## 2022-09-07 LAB — CBC WITH DIFFERENTIAL/PLATELET
Basophils Absolute: 0 10*3/uL (ref 0.0–0.1)
Basophils Relative: 0.7 % (ref 0.0–3.0)
Eosinophils Absolute: 0.2 10*3/uL (ref 0.0–0.7)
Eosinophils Relative: 2.7 % (ref 0.0–5.0)
HCT: 45.8 % (ref 36.0–46.0)
Hemoglobin: 14.9 g/dL (ref 12.0–15.0)
Lymphocytes Relative: 37.1 % (ref 12.0–46.0)
Lymphs Abs: 2.1 10*3/uL (ref 0.7–4.0)
MCHC: 32.6 g/dL (ref 30.0–36.0)
MCV: 91.3 fl (ref 78.0–100.0)
Monocytes Absolute: 0.4 10*3/uL (ref 0.1–1.0)
Monocytes Relative: 7 % (ref 3.0–12.0)
Neutro Abs: 3 10*3/uL (ref 1.4–7.7)
Neutrophils Relative %: 52.5 % (ref 43.0–77.0)
Platelets: 234 10*3/uL (ref 150.0–400.0)
RBC: 5.01 Mil/uL (ref 3.87–5.11)
RDW: 12.5 % (ref 11.5–15.5)
WBC: 5.7 10*3/uL (ref 4.0–10.5)

## 2022-09-07 LAB — VITAMIN B12: Vitamin B-12: 561 pg/mL (ref 211–911)

## 2022-09-07 LAB — TSH: TSH: 0.9 u[IU]/mL (ref 0.35–5.50)

## 2022-09-07 NOTE — Patient Instructions (Signed)
Call eye doctor to schedule follow up appointment

## 2022-09-07 NOTE — Progress Notes (Signed)
[facility] [address] [place] [address] Dept: [phone] Dept Fax: [phone]  New Patient Office Visit  Subjective:   Darlene Ellis 1985-03-02 09/07/2022  Chief Complaint  Patient presents with   Establish Care   Headache    Dizziness     HPI: Darlene Ellis presents today to establish care at [facility]. Introduced to Publishing rights manager role and practice setting.  All questions answered.  Concerns: See below   Darlene Ellis is a 37 yo F with who complains of chronic dizziness and headaches. She states dizziness is intermittent and as associated headaches that vary in location, symptoms have been ongoing for several years. Usually occurred once weekly or once every couple months, but now over the past 2 months symptoms have occurred daily. Headache described pressure, 2/10 with some days that are 4-5/10. Dizziness described "unbalanced feeling." She has had vertigo in the past and reports this is not the same dizziness as her vertigo.  Associated: intermittent nausea Denies: vomiting, no recent head injury or falls, motor or sensory deficits, diplopia, neck pain, syncope, CP, palpitations.  Patient did go to her eye doctor for evaluation 1 year ago, she states they told her that her eyes had improved at that time. She has both contacts and glasses, but prefers to wear contacts most of the time. She has not followed up with her eye doctor recently.  No medication/treatments tried for symptoms.     The following portions of the patient's history were reviewed and updated as appropriate: past medical history, past surgical history, family history, social history, allergies, medications, and problem list.   Patient Active Problem List   Diagnosis Date Noted   Labor and delivery, indication for care 05/23/2020   URI (upper respiratory infection) 03/13/2017    Labor and delivery indication for care or intervention 01/06/2017   Urinary urgency 01/03/2017   Gastroesophageal reflux in pregnancy 10/03/2016   Pregnancy 09/21/2016   Skin cyst 09/21/2016   Right upper quadrant pain 09/21/2016   PCO (polycystic ovaries) 10/18/2015   Past Medical History:  Diagnosis Date   Endometrial polyp    UTI (lower urinary tract infection)    Yeast infection    Past Surgical History:  Procedure Laterality Date   DILATION AND CURETTAGE OF UTERUS     Family History  Problem Relation Age of Onset   Hypertension Father    Ovarian cancer Maternal Aunt    Colon cancer Maternal Aunt    Diabetes Paternal Grandmother    Breast cancer Neg Hx    Outpatient Medications Prior to Visit  Medication Sig Dispense Refill   desogestrel-ethinyl estradiol (MIRCETTE) 0.15-0.02/0.01 MG (21/5) tablet Take 1 tablet by mouth at bedtime. (Patient not taking: Reported on 09/07/2022) 84 tablet 3   No facility-administered medications prior to visit.   Allergies  Allergen Reactions   Other     [place]    ROS: A complete ROS was performed with pertinent positives/negatives noted in the HPI. The remainder of the ROS are negative.   Objective:   Today's Vitals   09/07/22 0857  BP: 100/62  Pulse: 61  Temp: 98.1 F (36.7 C)  TempSrc: Temporal  SpO2: 99%  Weight: 130 lb 3.2 oz (59.1 kg)  Height: 4\' 11"  (1.499 m)    GENERAL: Well-appearing, in NAD. Well nourished.  SKIN: Pink, warm and dry. No rash, lesion, ulceration, or ecchymoses.  HEENT:  HEAD: Normocephalic, non-traumatic.  EYES: Conjunctive pink without exudate. PERRL, EOMI.  EARS: External ear w/o redness, swelling, masses, or lesions. EAC clear. TM's intact, translucent w/o bulging, appropriate landmarks visualized. THROAT: Uvula midline. Oropharynx clear. Tonsils non-inflamed w/o exudate. Mucus membranes pink and moist.  NECK: Trachea midline. Full ROM w/o pain or tenderness. No lymphadenopathy.   RESPIRATORY: Chest wall symmetrical. Respirations even and non-labored. Breath sounds clear to auscultation bilaterally.  CARDIAC: S1, S2 present, regular rate and rhythm. Peripheral pulses 2+ bilaterally.  EXTREMITIES: Without clubbing, cyanosis, or edema.  NEUROLOGIC: CN 2-12 intact. No motor or sensory deficits. Steady, even gait.  PSYCH/MENTAL STATUS: Alert, oriented x 3. Cooperative, appropriate mood and affect.   Health Maintenance Due  Topic Date Due   PAP SMEAR-Modifier  10/04/2021   INFLUENZA VACCINE  08/30/2022    Results for orders placed or performed in visit on 09/07/22  HM PAP SMEAR  Result Value Ref Range   HM Pap smear Dr. Logan Bores     Assessment & Plan:  1. Dizziness - CBC with Differential/Platelet - Comprehensive metabolic panel - TSH - VITAMIN D 25 Hydroxy (Vit-D Deficiency, Fractures) - Vitamin B12 - Iron, TIBC and Ferritin Panel  2. Other headache syndrome - labs collected today. Also recommend patient follow up with her eye doctor as her prescription may need to be changed, which could be causing the increase in her symptoms. If symptoms persist after seeing her eye doctor, will consider imaging and referral to neurology.      Return in about 3 months (around 12/08/2022).   Of note, portions of this note may have been created with voice recognition software Physicist, medical). While this note has been edited for accuracy, occasional wrong-word or 'sound-a-like' substitutions may have occurred due to the inherent limitations of voice recognition software.  Salvatore Decent, FNP

## 2022-09-07 NOTE — Telephone Encounter (Signed)
Please advise 

## 2022-09-07 NOTE — Telephone Encounter (Signed)
Pt was seen today 09/07/22 for headaches. She was advised to see an eye specialist. She did, they told her everything looked fine. She is wondering what the next step will be. Please advise pt at [phone]

## 2022-09-10 ENCOUNTER — Other Ambulatory Visit: Payer: Self-pay | Admitting: Internal Medicine

## 2022-09-10 DIAGNOSIS — G4489 Other headache syndrome: Secondary | ICD-10-CM

## 2022-09-10 DIAGNOSIS — R42 Dizziness and giddiness: Secondary | ICD-10-CM

## 2022-09-10 NOTE — Telephone Encounter (Signed)
Refer to neurology, referral placed by PCP.

## 2022-09-12 ENCOUNTER — Encounter: Payer: Self-pay | Admitting: Neurology

## 2022-11-07 NOTE — Progress Notes (Signed)
NEUROLOGY CONSULTATION NOTE  Darlene Ellis MRN: [id number] DOB: [birth year]  Referring provider: Salvatore Decent, FNP Primary care provider: Salvatore Decent, FNP  Reason for consult:  headaches and dizziness  Assessment/Plan:   New daily persistent headache Dizziness Facial numbness  The headache and dizziness may have been an intractable migraine, possibly triggered by the weather/change in barometric pressure during this past summer.  Facial numbness may be related to sinus disease.  However further evaluation is warranted.  Check MRI of brain with and without contrast Further recommendations pending results.  As headache resolved, would not start a preventative. Limit use of pain relievers to no more than 2 days out of week to prevent risk of rebound or medication-overuse headache. Keep headache diary Otherwise follow up in 6 months.   Total time spent in chart and face to face with patient:  46 minutes.   Subjective:  Darlene Ellis is a 37 year old right-handed female who presents for headaches and dizziness. History supplemented by referring provider's note.  She has had headaches on and off but they were never significant.  Over the summer she started experiencing a persistent headache which lasted for 3 months.  She endorses a persistent 2-3/10 low-grade pressure like headache.  It may be located across the front or back of head, both or either side of head.  Sometimes she will forget about it until she is reminded.  During this period of time, she may have episodes of nausea.  She also may have episodes of dizziness, difficult to describe but not vertigo (which she has had in the past).  It is not triggered by change in position and may occur while just standing in the kitchen and cooking.  Lasts just a few seconds.  Vision appeared "weird" but she couldn't elaborate. One time, it occurred while driving.  It would occur 2-3 times a week.  Sometimes, the headaches will  fluctuate.  On 4 or 5 days out of the 3 months, the headaches increased to 7-8/10 in which she took Tylenol and sleep it off.  Denies neck pain.  Jaw feels tired but no pain.  Denies increased stress or new medications.  It subsequently resolved in September.  She had an eye exam which was reportedly unremarkable.  This past week, she had an episode where she was driving and felt bilateral facial numbness.  She developed upper respiratory symptoms as well, facial pressure and nasal congestion.    Past NSAIDS/analgesics:  none Past abortive triptans:  none Past abortive ergotamine:  none Past muscle relaxants:  none Past anti-emetic:  Reglan Past antihypertensive medications:  none Past antidepressant medications:  none Past anticonvulsant medications:  none Past anti-CGRP:  none   Current NSAIDS/analgesics:  Tylenol Current triptans:  none Current ergotamine:  none Current anti-emetic:  none Current muscle relaxants:  none Current Antihypertensive medications:  none Current Antidepressant medications:  none Current Anticonvulsant medications:  none Current anti-CGRP:  none Current Vitamins/Herbal/Supplements:  none Current Antihistamines/Decongestants:  none Other therapy:  none Birth control:  none   Caffeine:  Decaff coffee; stopped drinking caffeine Diet:  4 tumblers of water daily.  Has coffee or hot chocolate and bread in morning, eats lunch, eats a little rice at night (not too hungry) Exercise:  no routine exercise.  However, she walks a lot with her children Depression:  no.  ; Anxiety:  improvedS Sleep hygiene:  sleeps from 9PM to 6AM.  Sleeps through the night but may wake up  briefly once or twice.  Sometimes feels tired but overall feels rested. Family history of headache:  she does not know    PAST MEDICAL HISTORY: Past Medical History:  Diagnosis Date   Endometrial polyp    UTI (lower urinary tract infection)    Yeast infection     PAST SURGICAL  HISTORY: Past Surgical History:  Procedure Laterality Date   DILATION AND CURETTAGE OF UTERUS      MEDICATIONS: Current Outpatient Medications on File Prior to Visit  Medication Sig Dispense Refill   desogestrel-ethinyl estradiol (MIRCETTE) 0.15-0.02/0.01 MG (21/5) tablet Take 1 tablet by mouth at bedtime. (Patient not taking: Reported on 09/07/2022) 84 tablet 3   No current facility-administered medications on file prior to visit.    ALLERGIES: Allergies  Allergen Reactions   Other     [place]    FAMILY HISTORY: Family History  Problem Relation Age of Onset   Hypertension Father    Ovarian cancer Maternal Aunt    Colon cancer Maternal Aunt    Diabetes Paternal Grandmother    Breast cancer Neg Hx     Objective:  Blood pressure 123/81, pulse 75, height 4\' 11"  (1.499 m), weight 135 lb (61.2 kg), SpO2 99%, currently breastfeeding. General: No acute distress.  Patient appears well-groomed.   Head:  Normocephalic/atraumatic.  No TMJ tenderness to palpation Eyes:  fundi examined but not visualized Neck: supple, no paraspinal tenderness, full range of motion Heart: regular rate and rhythm Neurological Exam: Mental status: alert and oriented to person, place, and time, speech fluent and not dysarthric, language intact. Cranial nerves: CN I: not tested CN II: pupils equal, round and reactive to light, visual fields intact CN III, IV, VI:  full range of motion, no nystagmus, no ptosis CN V: facial sensation intact. CN VII: upper and lower face symmetric CN VIII: hearing intact CN IX, X: gag intact, uvula midline CN XI: sternocleidomastoid and trapezius muscles intact CN XII: tongue midline Bulk & Tone: normal, no fasciculations. Motor:  muscle strength 5/5 throughout Sensation:  Pinprick and vibratory sensation intact. Deep Tendon Reflexes:  2+ throughout,  toes downgoing.   Finger to nose testing:  Without dysmetria.   Gait:  Normal station and stride.  Romberg  negative.    Thank you for allowing me to take part in the care of this patient.  Shon Millet, DO  CC: Salvatore Decent, FNP

## 2022-11-09 ENCOUNTER — Ambulatory Visit: Payer: 59 | Admitting: Neurology

## 2022-11-09 ENCOUNTER — Encounter: Payer: Self-pay | Admitting: Neurology

## 2022-11-09 VITALS — BP 123/81 | HR 75 | Ht 59.0 in | Wt 135.0 lb

## 2022-11-09 DIAGNOSIS — R200 Anesthesia of skin: Secondary | ICD-10-CM

## 2022-11-09 DIAGNOSIS — R42 Dizziness and giddiness: Secondary | ICD-10-CM

## 2022-11-09 DIAGNOSIS — G4452 New daily persistent headache (NDPH): Secondary | ICD-10-CM

## 2022-11-09 NOTE — Patient Instructions (Signed)
It may have been a persistent migraine triggered by the weather Will check MRI of brain with and without contrast

## 2023-01-03 ENCOUNTER — Encounter: Payer: Self-pay | Admitting: Neurology

## 2023-01-09 ENCOUNTER — Ambulatory Visit
Admission: RE | Admit: 2023-01-09 | Discharge: 2023-01-09 | Disposition: A | Discharge status: Home / Self Care | Payer: 59 | Source: Ambulatory Visit | Attending: Neurology | Admitting: Neurology

## 2023-01-09 DIAGNOSIS — R200 Anesthesia of skin: Secondary | ICD-10-CM

## 2023-01-09 DIAGNOSIS — G4452 New daily persistent headache (NDPH): Secondary | ICD-10-CM

## 2023-01-09 MED ORDER — GADOPICLENOL 0.5 MMOL/ML IV SOLN
6.0000 mL | Freq: Once | INTRAVENOUS | Status: AC | PRN
  Administered 2023-01-09: 6 mL via INTRAVENOUS

## 2023-01-10 ENCOUNTER — Telehealth: Payer: Self-pay

## 2023-01-10 NOTE — Telephone Encounter (Signed)
LMOVM for patient to call the office back.  Per Dr.Jaffe, If she is looking for something to take as needed, we can prescribe rizatriptan 10mg .  If she is agreeable, send for 10mg  as needed.  May repeat after 2 hours.  Maximum 2 tablets in 24 hours.

## 2023-01-10 NOTE — Telephone Encounter (Signed)
-----   Message from Cira Servant sent at 01/10/2023  9:33 AM EST ----- If she is looking for something to take as needed, we can prescribe rizatriptan 10mg .  If she is agreeable, send for 10mg  as needed.  May repeat after 2 hours.  Maximum 2 tablets in 24 hours. ----- Message ----- From: Leida Lauth, CMA Sent: 01/10/2023   9:13 AM EST To: Drema Dallas, DO  Patient advised of her MRI. Patient would like to see what she can take. The headaches are still there every now and then.

## 2023-01-10 NOTE — Progress Notes (Signed)
Patient advised of her MRI. Patient would like to see what she can take. The headaches are still there every now and then.

## 2023-01-14 ENCOUNTER — Telehealth: Payer: Self-pay

## 2023-01-14 ENCOUNTER — Other Ambulatory Visit: Payer: Self-pay

## 2023-01-14 DIAGNOSIS — G4452 New daily persistent headache (NDPH): Secondary | ICD-10-CM

## 2023-01-14 MED ORDER — RIZATRIPTAN BENZOATE 10 MG PO TBDP: 10.0000 mg | ORAL_TABLET | ORAL | 11 refills | Status: DC | PRN

## 2023-01-14 NOTE — Telephone Encounter (Signed)
-----   Message from Cira Servant sent at 01/10/2023  9:33 AM EST ----- If she is looking for something to take as needed, we can prescribe rizatriptan 10mg .  If she is agreeable, send for 10mg  as needed.  May repeat after 2 hours.  Maximum 2 tablets in 24 hours. ----- Message ----- From: Leida Lauth, CMA Sent: 01/10/2023   9:13 AM EST To: Drema Dallas, DO  Patient advised of her MRI. Patient would like to see what she can take. The headaches are still there every now and then.

## 2023-01-22 ENCOUNTER — Ambulatory Visit: Payer: 59 | Admitting: Neurology

## 2023-03-05 ENCOUNTER — Ambulatory Visit: Payer: 59 | Admitting: Obstetrics and Gynecology

## 2023-03-05 DIAGNOSIS — Z01419 Encounter for gynecological examination (general) (routine) without abnormal findings: Secondary | ICD-10-CM

## 2023-03-05 DIAGNOSIS — Z124 Encounter for screening for malignant neoplasm of cervix: Secondary | ICD-10-CM

## 2023-03-21 ENCOUNTER — Ambulatory Visit: Payer: 59 | Admitting: Obstetrics and Gynecology

## 2023-03-21 DIAGNOSIS — Z01419 Encounter for gynecological examination (general) (routine) without abnormal findings: Secondary | ICD-10-CM

## 2023-03-27 ENCOUNTER — Encounter: Payer: Self-pay | Admitting: Obstetrics and Gynecology

## 2023-05-02 ENCOUNTER — Telehealth: Payer: Self-pay

## 2023-05-02 NOTE — Telephone Encounter (Signed)
 Copied from CRM [id number]. Topic: Referral - Question >> May 02, 2023  1:34 PM Jon Gills C wrote: Reason for CRM: Patient called in wanting to know if she could get an referral to the ENT, stated she believes she may have tonsillitis, and would like to be seen by an ENT even though she hasn't discussed this with her provider yet, would like a callback regarding this    [phone]

## 2023-05-02 NOTE — Telephone Encounter (Signed)
 Called patient and advised that we needed to schedule an appointment to evaluate this.   She is scheduled with Lauren 05/03/23 @3 :00. Dm/cma

## 2023-05-03 ENCOUNTER — Encounter: Payer: Self-pay | Admitting: Nurse Practitioner

## 2023-05-03 ENCOUNTER — Ambulatory Visit (INDEPENDENT_AMBULATORY_CARE_PROVIDER_SITE_OTHER): Admitting: Nurse Practitioner

## 2023-05-03 VITALS — BP 114/78 | HR 87 | Temp 97.4°F | Ht 59.0 in | Wt 127.6 lb

## 2023-05-03 DIAGNOSIS — J301 Allergic rhinitis due to pollen: Secondary | ICD-10-CM | POA: Diagnosis not present

## 2023-05-03 DIAGNOSIS — J039 Acute tonsillitis, unspecified: Secondary | ICD-10-CM | POA: Insufficient documentation

## 2023-05-03 LAB — POCT RAPID STREP A (OFFICE): Rapid Strep A Screen: NEGATIVE

## 2023-05-03 MED ORDER — LORATADINE 10 MG PO TABS: 10.0000 mg | ORAL_TABLET | Freq: Every day | ORAL | 0 refills | Status: DC

## 2023-05-03 MED ORDER — AMOXICILLIN 500 MG PO CAPS: 500.0000 mg | ORAL_CAPSULE | Freq: Two times a day (BID) | ORAL | 0 refills | Status: AC

## 2023-05-03 NOTE — Progress Notes (Signed)
 Acute Office Visit  Subjective:     Patient ID: Darlene Ellis, female    DOB: 1985/07/25, 38 y.o.   MRN: [id number]  Chief Complaint  Patient presents with   Tonsils Feel Itchy    For 1 week tonsils feel itchy and questions if pus in on there    HPI Discussed the use of AI scribe software for clinical note transcription with the patient, who gave verbal consent to proceed.  History of Present Illness   The patient, with a history of allergies, presents with itchy tonsils and a sensation of something stuck in her throat. The symptoms began a week ago and have been persistent. The patient describes the sensation as if something is blocking her throat, especially noticeable when swallowing. This has caused discomfort and a feeling of choking when eating. The patient has tried gargling with salt water for relief but reports no significant improvement. The patient also reports intermittent runny nose, which she attributes to her allergies. She denies any fever or significant ear pain, but describes a vague sensation of pressure or itchiness in her ears.      ROS See pertinent positives and negatives per HPI.     Objective:    BP 114/78 (BP Location: Left Arm, Patient Position: Sitting, Cuff Size: Normal)   Pulse 87   Temp (!) 97.4 F (36.3 C)   Ht 4\' 11"  (1.499 m)   Wt 127 lb 9.6 oz (57.9 kg)   LMP 04/15/2023 (Exact Date)   PF 100 L/min   Breastfeeding No   BMI 25.77 kg/m    Physical Exam Vitals and nursing note reviewed.  Constitutional:      General: She is not in acute distress.    Appearance: Normal appearance.  HENT:     Head: Normocephalic.     Right Ear: Tympanic membrane, ear canal and external ear normal.     Left Ear: Tympanic membrane, ear canal and external ear normal.     Mouth/Throat:     Mouth: Mucous membranes are moist.     Pharynx: Oropharyngeal exudate (right tonsil) and posterior oropharyngeal erythema present.  Eyes:      Conjunctiva/sclera: Conjunctivae normal.  Cardiovascular:     Rate and Rhythm: Normal rate and regular rhythm.     Pulses: Normal pulses.     Heart sounds: Normal heart sounds.  Pulmonary:     Effort: Pulmonary effort is normal.     Breath sounds: Normal breath sounds.  Musculoskeletal:     Cervical back: Normal range of motion and neck supple. No tenderness.  Lymphadenopathy:     Cervical: No cervical adenopathy.  Skin:    General: Skin is warm.  Neurological:     General: No focal deficit present.     Mental Status: She is alert and oriented to person, place, and time.  Psychiatric:        Mood and Affect: Mood normal.        Behavior: Behavior normal.        Thought Content: Thought content normal.        Judgment: Judgment normal.     Results for orders placed or performed in visit on 05/03/23  POCT rapid strep A  Result Value Ref Range   Rapid Strep A Screen Negative Negative        Assessment & Plan:   Problem List Items Addressed This Visit       Respiratory   Tonsillitis - Primary  Symptoms indicate a mild bacterial tonsil infection, though rapid strep test negative. Prescribe amoxicillin 500 mg twice daily for 10 days. Advise warm salt water gargles and encourage increased fluid intake, including hot tea. Instruct to report if symptoms do not improve.      Relevant Orders   POCT rapid strep A (Completed)   Other Visit Diagnoses       Seasonal allergic rhinitis due to pollen       Intermittent rhinorrhea and itchy tonsils are consistent with allergic rhinitis. Prescribe loratadine 10 mg daily.       Meds ordered this encounter  Medications   amoxicillin (AMOXIL) 500 MG capsule    Sig: Take 1 capsule (500 mg total) by mouth 2 (two) times daily for 10 days.    Dispense:  20 capsule    Refill:  0   loratadine (CLARITIN) 10 MG tablet    Sig: Take 1 tablet (10 mg total) by mouth daily.    Dispense:  30 tablet    Refill:  0    Return if symptoms  worsen or fail to improve.  Gerre Scull, NP

## 2023-05-03 NOTE — Assessment & Plan Note (Signed)
 Symptoms indicate a mild bacterial tonsil infection, though rapid strep test negative. Prescribe amoxicillin 500 mg twice daily for 10 days. Advise warm salt water gargles and encourage increased fluid intake, including hot tea. Instruct to report if symptoms do not improve.

## 2023-05-03 NOTE — Patient Instructions (Signed)
 It was great to see you!  Start amoxicillin twice a day for 10 days  Start claritin 1 tablet daily  Keep gargling with warm salt water, drinking fluids, and hot tea  Let's follow-up if symptoms worsen or don't improve  Take care,  Rodman Pickle, NP

## 2023-05-16 ENCOUNTER — Ambulatory Visit: Payer: 59 | Admitting: Neurology

## 2023-05-26 ENCOUNTER — Encounter (HOSPITAL_BASED_OUTPATIENT_CLINIC_OR_DEPARTMENT_OTHER): Payer: Self-pay | Admitting: Emergency Medicine

## 2023-05-26 ENCOUNTER — Emergency Department (HOSPITAL_BASED_OUTPATIENT_CLINIC_OR_DEPARTMENT_OTHER): Admitting: Radiology

## 2023-05-26 ENCOUNTER — Other Ambulatory Visit: Payer: Self-pay

## 2023-05-26 ENCOUNTER — Emergency Department (HOSPITAL_BASED_OUTPATIENT_CLINIC_OR_DEPARTMENT_OTHER): Admission: EM | Admit: 2023-05-26 | Discharge: 2023-05-26 | Disposition: A | Discharge status: Home / Self Care

## 2023-05-26 ENCOUNTER — Other Ambulatory Visit: Payer: Self-pay | Admitting: Nurse Practitioner

## 2023-05-26 DIAGNOSIS — R42 Dizziness and giddiness: Secondary | ICD-10-CM | POA: Diagnosis present

## 2023-05-26 DIAGNOSIS — R519 Headache, unspecified: Secondary | ICD-10-CM | POA: Diagnosis not present

## 2023-05-26 DIAGNOSIS — R000 Tachycardia, unspecified: Secondary | ICD-10-CM | POA: Insufficient documentation

## 2023-05-26 DIAGNOSIS — R202 Paresthesia of skin: Secondary | ICD-10-CM | POA: Diagnosis not present

## 2023-05-26 LAB — COMPREHENSIVE METABOLIC PANEL WITH GFR
ALT: 13 U/L (ref 0–44)
AST: 21 U/L (ref 15–41)
Albumin: 5.2 g/dL — ABNORMAL HIGH (ref 3.5–5.0)
Alkaline Phosphatase: 68 U/L (ref 38–126)
Anion gap: 12 (ref 5–15)
BUN: 6 mg/dL (ref 6–20)
CO2: 27 mmol/L (ref 22–32)
Calcium: 9.9 mg/dL (ref 8.9–10.3)
Chloride: 102 mmol/L (ref 98–111)
Creatinine, Ser: 0.66 mg/dL (ref 0.44–1.00)
GFR, Estimated: >60 mL/min (ref >60)
Glucose, Bld: 89 mg/dL (ref 70–99)
Potassium: 3.3 mmol/L — ABNORMAL LOW (ref 3.5–5.1)
Sodium: 141 mmol/L (ref 135–145)
Total Bilirubin: 0.7 mg/dL (ref 0.0–1.2)
Total Protein: 8 g/dL (ref 6.5–8.1)

## 2023-05-26 LAB — CBC WITH DIFFERENTIAL/PLATELET
Abs Immature Granulocytes: 0.01 10*3/uL (ref 0.00–0.07)
Basophils Absolute: 0 10*3/uL (ref 0.0–0.1)
Basophils Relative: 0 %
Eosinophils Absolute: 0.2 10*3/uL (ref 0.0–0.5)
Eosinophils Relative: 6 %
HCT: 45.4 % (ref 36.0–46.0)
Hemoglobin: 15.5 g/dL — ABNORMAL HIGH (ref 12.0–15.0)
Immature Granulocytes: 0 %
Lymphocytes Relative: 45 %
Lymphs Abs: 1.5 10*3/uL (ref 0.7–4.0)
MCH: 29.7 pg (ref 26.0–34.0)
MCHC: 34.1 g/dL (ref 30.0–36.0)
MCV: 87 fL (ref 80.0–100.0)
Monocytes Absolute: 0.3 10*3/uL (ref 0.1–1.0)
Monocytes Relative: 10 %
Neutro Abs: 1.3 10*3/uL — ABNORMAL LOW (ref 1.7–7.7)
Neutrophils Relative %: 39 %
Platelets: 234 10*3/uL (ref 150–400)
RBC: 5.22 MIL/uL — ABNORMAL HIGH (ref 3.87–5.11)
RDW: 12 % (ref 11.5–15.5)
WBC: 3.4 10*3/uL — ABNORMAL LOW (ref 4.0–10.5)
nRBC: 0 % (ref 0.0–0.2)

## 2023-05-26 LAB — CBG MONITORING, ED: Glucose-Capillary: 67 mg/dL — ABNORMAL LOW (ref 70–99)

## 2023-05-26 LAB — TROPONIN T, HIGH SENSITIVITY
Troponin T High Sensitivity: <15 ng/L (ref <19)
Troponin T High Sensitivity: <15 ng/L (ref <19)

## 2023-05-26 LAB — HCG, SERUM, QUALITATIVE: Preg, Serum: NEGATIVE

## 2023-05-26 NOTE — ED Notes (Signed)
 2nd troponin recollected and sent to lab. Patient also ambulated to the bathroom independetly. Patient is now back in room.

## 2023-05-26 NOTE — ED Provider Notes (Signed)
  EMERGENCY DEPARTMENT AT [facility] Provider Note   CSN: [id number] Arrival date & time: 05/26/23  1545     History  Chief Complaint  Patient presents with   Dizziness    Darlene Ellis is a 38 y.o. female who has complained of racing heart dizziness headaches that are chronic.  Unfortunately her story is also very difficult to follow as her her timeline and history skills are poor.  She has a past medical history of chronic headaches previous episodes of vertigo.  She sees neurology and is already had MRIs that been negative.  It seems the patient woke up normally went to the bathroom lay back down and then began feeling her heart pounding this worried her because she thought she might have a cardiac arrest because she has a brother who had a heart attack in his 30s she has no other family history that is contributing in a cardiac way.  Patient states that she laid there for 30 minutes got up to care for her daughter to care for her son and then felt like she might be short of breath she felt perioral numbness and sensation of pressure on the inside of her head she did have a frontal headache which she has all the time.  She states that she eventually took a shower at some point and then felt very lightheaded but denies near syncope.  She is complaining that is she feels off balance and a little dizzy when she turns her head and so she has been walking more slowly.  She denies use of any oral contraceptives.  She denies unilateral leg swelling history of DVT or PE.  She states that she has felt a little bit foggy when she is thinking and apparently had trouble pronouncing a word earlier in show in triage was labeled as having aphasia.  She reports that this was the only word she had trouble pronouncing and she got frustrated.  English is not her first language.  She was able to say all the other words.  Patient Also Reports That She looking at her Apple Watch and also  used a pulse ox at her heart rate was never higher than 75 or 85.   Dizziness      Home Medications Prior to Admission medications   Medication Sig Start Date End Date Taking? Authorizing Provider  loratadine  (CLARITIN ) 10 MG tablet Take 1 tablet (10 mg total) by mouth daily. 05/03/23   McElwee, Lauren A, NP  rizatriptan  (MAXALT -MLT) 10 MG disintegrating tablet Take 1 tablet (10 mg total) by mouth as needed for migraine. May repeat in 2 hours if needed.  Maximum 2 tablets in 24 hours Patient not taking: Reported on 05/03/2023 01/14/23   Merriam Abbey, DO      Allergies    Other    Review of Systems   Review of Systems  Neurological:  Positive for dizziness.    Physical Exam Updated Vital Signs BP 133/84 (BP Location: Right Arm)   Pulse 81   Temp 98 F (36.7 C)   Resp 16   Ht 4\' 11"  (1.499 m)   Wt 61.2 kg   LMP 05/15/2023 (Approximate)   SpO2 100%   BMI 27.27 kg/m  Physical Exam Vitals and nursing note reviewed.  Constitutional:      General: She is not in acute distress.    Appearance: She is well-developed. She is not diaphoretic.  HENT:     Head: Normocephalic and  atraumatic.     Right Ear: External ear normal.     Left Ear: External ear normal.     Nose: Nose normal.     Mouth/Throat:     Mouth: Mucous membranes are moist.  Eyes:     General: No scleral icterus.    Conjunctiva/sclera: Conjunctivae normal.  Cardiovascular:     Rate and Rhythm: Normal rate and regular rhythm.     Heart sounds: Normal heart sounds. No murmur heard.    No friction rub. No gallop.  Pulmonary:     Effort: Pulmonary effort is normal. No respiratory distress.     Breath sounds: Normal breath sounds.  Abdominal:     General: Bowel sounds are normal. There is no distension.     Palpations: Abdomen is soft. There is no mass.     Tenderness: There is no abdominal tenderness. There is no guarding.  Musculoskeletal:     Cervical back: Normal range of motion.  Skin:    General: Skin  is warm and dry.  Neurological:     Mental Status: She is alert and oriented to person, place, and time.     Comments: Speech is clear and goal oriented, follows commands Major Cranial nerves without deficit, no facial droop Normal strength in upper and lower extremities bilaterally including dorsiflexion and plantar flexion, strong and equal grip strength Sensation normal to light and sharp touch Moves extremities without ataxia, coordination intact Normal finger to nose and rapid alternating movements Neg romberg, no pronator drift Normal gait Normal heel-shin and balance   Psychiatric:        Behavior: Behavior normal.     ED Results / Procedures / Treatments   Labs (all labs ordered are listed, but only abnormal results are displayed) Labs Reviewed  CBC WITH DIFFERENTIAL/PLATELET - Abnormal; Notable for the following components:      Result Value   WBC 3.4 (*)    RBC 5.22 (*)    Hemoglobin 15.5 (*)    Neutro Abs 1.3 (*)    All other components within normal limits  CBG MONITORING, ED - Abnormal; Notable for the following components:   Glucose-Capillary 67 (*)    All other components within normal limits  PREGNANCY, URINE  COMPREHENSIVE METABOLIC PANEL WITH GFR  TROPONIN T, HIGH SENSITIVITY    EKG None  Radiology No results found.  Procedures Procedures    Medications Ordered in ED Medications - No data to display  ED Course/ Medical Decision Making/ A&P                                 Medical Decision Making Amount and/or Complexity of Data Reviewed Labs: ordered. Radiology: ordered.   Patient is currently asymptomatic here in the emergency department still states that she feels a little foggy headed.  She denies any active headache or off-balance symptoms. Patient was offered medications for both and she declined these. Patient was worried she could be having a TIA however none of the symptoms she has discussed today sound remotely like a  stroke. Patient was also concerned she could be having a cardiac arrest.  None of the symptoms she is stated today also sound like a cardiac arrest or ACS.  She has some what higher risk for heart attacks given of primary sibling with early heart attack but has no other risk factors at all which still makes her have a heart score of  1 however today symptoms do not sound like she is having a heart attack or is ACS. Patient's symptoms sound most concerning with recurrent vertigo and anxiety about health.  I ordered labs CBC shows no significant abnormality except for mildly elevated hemoglobin.  Patient's CBG at 67 she is offered food we will recheck her CBG.  Remainder of her labs are currently pending.  EKG shows normal sinus rhythm at a rate of 67.  Sign out given to PA Sylvester Evert at shift change for appropriate disposition.       Final Clinical Impression(s) / ED Diagnoses Final diagnoses:  None    Rx / DC Orders ED Discharge Orders     None         Tama Fails, PA-C 05/28/23 1212    Rolinda Climes, DO 05/30/23 2128

## 2023-05-26 NOTE — ED Triage Notes (Signed)
 Pt arrived POV, caox4, ambulatory stating she has had multiple episodes of dizziness, visual abnormalities, palpitations, head fullness, expressive aphasia, and feeling off balanced at home throughout the day today. Pt caox4 at present with clear speech, no obvious weakness, no aphasia, no facial droop.

## 2023-05-26 NOTE — ED Notes (Signed)
 Pt given orange juice and crackers d/t CBG

## 2023-05-26 NOTE — ED Notes (Signed)
 CBG is 107. Glucometer not transmitting properly per tech.

## 2023-05-26 NOTE — Discharge Instructions (Addendum)
 Your symptoms are most consistent with recurrent vertigo.  I think that some of your symptoms may have been related to a bit of anxiety about the situation however-you are continuing to have episodes of racing heart you will need to follow close with your primary care physician.  You may use over-the-counter medications such as less drowsy Dramamine also known as Bonine also known as meclizine this is very helpful for symptoms of dizziness and imbalance.  You are also found to have an incidental finding on your chest x-ray that requires follow-up outpatient with your PCP for repeat imaging in the future. If you continue to have episodes of recurrent vertigo you will need to follow-up with either your neurologist or an ear nose and throat doctor.  Get help right away if you: Have difficulty speaking or moving. Are always dizzy or faint. Develop severe headaches. Have weakness in your legs or arms. Have changes in your hearing or vision. Develop a stiff neck. Develop sensitivity to light.

## 2023-05-27 ENCOUNTER — Telehealth: Payer: Self-pay

## 2023-05-27 LAB — CBG MONITORING, ED: Glucose-Capillary: 108 mg/dL — ABNORMAL HIGH (ref 70–99)

## 2023-05-27 NOTE — Transitions of Care (Post Inpatient/ED Visit) (Signed)
   05/27/2023  Name: Darlene Ellis MRN: [id number] DOB: [birth year]  Today's TOC FU Call Status: Today's TOC FU Call Status:: Successful TOC FU Call Completed TOC FU Call Complete Date: 05/27/23 Patient's Name and Date of Birth confirmed.  Transition Care Management Follow-up Telephone Call Date of Discharge: 05/26/23 Discharge Facility: [facility] ([facility] Type of Discharge: Emergency Department Reason for ED Visit: Other: (vertigo) How have you been since you were released from the hospital?: Same Any questions or concerns?: Yes Patient Questions/Concerns:: Pt C/O of ongoing dizziness and tonsil discomfort Patient Questions/Concerns Addressed: Provided Patient Educational Materials  Items Reviewed: Did you receive and understand the discharge instructions provided?: Yes Medications obtained,verified, and reconciled?: Yes (Medications Reviewed) Any new allergies since your discharge?: No Dietary orders reviewed?: NA Do you have support at home?: No  Medications Reviewed Today: Medications Reviewed Today     Reviewed by Lonie Roa, CMA (Certified Medical Assistant) on 05/27/23 at 1643  Med List Status: <None>   Medication Order Taking? Sig Documenting Provider Last Dose Status Informant  loratadine  (CLARITIN ) 10 MG tablet [id number] Yes TAKE 1 TABLET BY MOUTH EVERY DAY Gavin Kast, FNP Taking Active   rizatriptan  (MAXALT -MLT) 10 MG disintegrating tablet [id number] No Take 1 tablet (10 mg total) by mouth as needed for migraine. May repeat in 2 hours if needed.  Maximum 2 tablets in 24 hours  Patient not taking: Reported on 05/27/2023   Merriam Abbey, DO Not Taking Active             Home Care and Equipment/Supplies: Were Home Health Services Ordered?: NA Any new equipment or medical supplies ordered?: NA  Functional Questionnaire: Do you need assistance with bathing/showering or dressing?: No Do you need assistance with meal preparation?: No Do you  need assistance with eating?: No Do you have difficulty maintaining continence: No Do you need assistance with getting out of bed/getting out of a chair/moving?: No Do you have difficulty managing or taking your medications?: No  Follow up appointments reviewed: PCP Follow-up appointment confirmed?: Yes Date of PCP follow-up appointment?: 06/04/23 Follow-up Provider: Gavin Kast NP [facility] Follow-up appointment confirmed?: NA Do you need transportation to your follow-up appointment?: No Do you understand care options if your condition(s) worsen?: Yes-patient verbalized understanding    SIGNATURE Kirby Peoples, RMA

## 2023-05-27 NOTE — Telephone Encounter (Addendum)
 Requesting:  LORATADINE  10 MG TABLET  Last Visit: 05/03/2023 Next Visit: Visit date not found Last Refill: 05/03/2023  Please Advise    Pharmacy comment: REQUEST FOR 90 DAYS PRESCRIPTION.

## 2023-06-04 ENCOUNTER — Encounter: Payer: Self-pay | Admitting: Internal Medicine

## 2023-06-04 ENCOUNTER — Ambulatory Visit: Admitting: Internal Medicine

## 2023-06-04 VITALS — BP 94/62 | HR 88 | Temp 98.1°F | Ht 59.0 in | Wt 126.4 lb

## 2023-06-04 DIAGNOSIS — R42 Dizziness and giddiness: Secondary | ICD-10-CM | POA: Diagnosis not present

## 2023-06-04 DIAGNOSIS — J358 Other chronic diseases of tonsils and adenoids: Secondary | ICD-10-CM

## 2023-06-04 DIAGNOSIS — J301 Allergic rhinitis due to pollen: Secondary | ICD-10-CM

## 2023-06-04 DIAGNOSIS — R9389 Abnormal findings on diagnostic imaging of other specified body structures: Secondary | ICD-10-CM

## 2023-06-04 NOTE — Progress Notes (Signed)
 [facility] [address] [place] [address] Dept: [phone] Dept Fax: [phone]  Acute Care Office Visit  Subjective:   Darlene Ellis [birth year] 06/04/2023  Chief Complaint  Patient presents with   Hospitalization Follow-up    HPI:  Discussed the use of AI scribe software for clinical note transcription with the patient, who gave verbal consent to proceed.  History of Present Illness   Darlene Ellis is a 38 year old female who presents for follow up from ER visit on 4/27 for  dizziness and heart palpitations.  She had been experiencing episodes of heart racing and dizziness one morning upon awakening, which led to a recent ER visit. During one episode, she felt lightheaded and dizzy while in the shower, needing support to prevent falling. The dizziness felt like a loss of balance rather than vertigo. Her husband checked her heart rate at home, which appeared normal, but she could still feel her heart racing.  In the ER, blood work and an EKG were normal, but her blood sugar was slightly low at 67 mg/dL. Consuming juice helped alleviate her symptoms. At home, her symptoms improved after consuming juice and cake. She also tried elevating her feet to improve blood flow to her head, but this caused a sensation of pressure, so she stopped. She continues to experience some dizziness, particularly when moving quickly or sitting down, but denies any further heart palpitations. CXR at ER also revealed an abnormal finding, possible artifact. Recommended repeat 2view chest.   Her vision feels 'weird,' with her eyes acting independently, requiring her to correct her focus. An eye doctor found no issues, and a neurologist performed a normal MRI. She has a follow-up appointment with neurology scheduled for next week.  She reports sinus-related symptoms, including pain in the sinus areas and a sensation of pressure  when inhaling cold air. She is currently taking Claritin  but is unsure of its effectiveness. She describes a sensation of dryness in her throat and occasionally feels something when swallowing, which she suspects might be a tonsil stone. She has tried to remove it without success and experiences a dry throat but no significant pain.       The following portions of the patient's history were reviewed and updated as appropriate: past medical history, past surgical history, family history, social history, allergies, medications, and problem list.   Patient Active Problem List   Diagnosis Date Noted   Tonsillitis 05/03/2023   Labor and delivery, indication for care 05/23/2020   URI (upper respiratory infection) 03/13/2017   Labor and delivery indication for care or intervention 01/06/2017   Urinary urgency 01/03/2017   Gastroesophageal reflux in pregnancy 10/03/2016   Pregnancy 09/21/2016   Skin cyst 09/21/2016   Right upper quadrant pain 09/21/2016   PCO (polycystic ovaries) 10/18/2015   Past Medical History:  Diagnosis Date   Endometrial polyp    UTI (lower urinary tract infection)    Yeast infection    Past Surgical History:  Procedure Laterality Date   DILATION AND CURETTAGE OF UTERUS     Family History  Problem Relation Age of Onset   Other Mother        Hysterectomy   Hypertension Father    Breast cancer Maternal Aunt    Ovarian cancer Maternal Aunt    Colon cancer Maternal Aunt    Diabetes Paternal Grandmother     Current Outpatient Medications:    loratadine  (CLARITIN ) 10 MG tablet,  TAKE 1 TABLET BY MOUTH EVERY DAY, Disp: 90 tablet, Rfl: 1   rizatriptan  (MAXALT -MLT) 10 MG disintegrating tablet, Take 1 tablet (10 mg total) by mouth as needed for migraine. May repeat in 2 hours if needed.  Maximum 2 tablets in 24 hours (Patient not taking: Reported on 05/03/2023), Disp: 9 tablet, Rfl: 11 Allergies  Allergen Reactions   Other     [place]     ROS: A complete ROS was  performed with pertinent positives/negatives noted in the HPI. The remainder of the ROS are negative.    Objective:   Today's Vitals   06/04/23 1031  BP: 94/62  Pulse: 88  Temp: 98.1 F (36.7 C)  TempSrc: Temporal  SpO2: 92%  Weight: 126 lb 6.4 oz (57.3 kg)  Height: 4\' 11"  (1.499 m)    GENERAL: Well-appearing, in NAD. Well nourished.  SKIN: Pink, warm and dry. No rash, lesion, ulceration, or ecchymoses.  HEENT:    HEAD: Normocephalic, non-traumatic.  EYES: Conjunctive pink without exudate. NOSE: Septum midline w/o deformity. Nares patent, mucosa pink and non-inflamed w/o drainage. No sinus tenderness.  THROAT: Uvula midline. Oropharynx clear. Tonsils non-inflamed. Tonsillar stone to R. Tonsil. Mucus membranes pink and moist.  NECK: Trachea midline. Full ROM w/o pain or tenderness. No lymphadenopathy.  RESPIRATORY: Chest wall symmetrical. Respirations even and non-labored. Breath sounds clear to auscultation bilaterally.  CARDIAC: S1, S2 present, regular rate and rhythm. Peripheral pulses 2+ bilaterally.  EXTREMITIES: Without clubbing, cyanosis, or edema.  NEUROLOGIC:  Steady, even gait.  PSYCH/MENTAL STATUS: Alert, oriented x 3. Cooperative, appropriate mood and affect.    No results found for any visits on 06/04/23.    Assessment & Plan:  Assessment and Plan    Dizziness  Recent episode with palpitations, normal EKG, mild hypoglycemia noted. MRI normal, possible vestibular component. Neurology follow-up pending. - Continue neurology follow-up next week. - Discuss potential need for vestibular therapy with neurology.  Allergic rhinitis Sinus pressure and discomfort, limited relief with Claritin , no infection signs. - Discontinue Claritin  and start Zyrtec or Allegra. - Consider nasal spray if symptoms persist.  Tonsil stone Right tonsil stone causing mild discomfort, no infection. - Perform warm salt water gargles. - Monitor for worsening symptoms.  Abnormal Finding  on Chest Xray  Chest x-ray showed possible artifact, no fluid or infiltrates. - Order repeat PA and lateral chest x-ray in three weeks.      No orders of the defined types were placed in this encounter.  Orders Placed This Encounter  Procedures   DG Chest 2 View    Standing Status:   Future    Expected Date:   06/25/2023    Expiration Date:   06/03/2024    Reason for Exam (SYMPTOM  OR DIAGNOSIS REQUIRED):   abnormal finding on last chest xray    Is patient pregnant?:   No    Preferred imaging location?:   Internal   Lab Orders  No laboratory test(s) ordered today   No images are attached to the encounter or orders placed in the encounter.  Return in about 3 months (around 09/04/2023) for Annual Physical Exam with fasting lab work.   Gavin Kast, FNP

## 2023-06-04 NOTE — Patient Instructions (Addendum)
 Stop Claritin . Try take once daily Zyrtec OR Allegra for allergy symptoms   Go for chest xray in 3 weeks  Continue follow up with neurology as scheduled - may need to do vestibular therapy for dizziness

## 2023-06-13 NOTE — Progress Notes (Unsigned)
 NEUROLOGY FOLLOW UP OFFICE NOTE  Darlene Ellis [id number]  Assessment/Plan:   New daily persistent headache Dizziness Facial numbness  The headache and dizziness may have been an intractable migraine, possibly triggered by the weather/change in barometric pressure during this past summer.  Facial numbness may be related to sinus disease.  However further evaluation is warranted.  Check MRI of brain with and without contrast Further recommendations pending results.  As headache resolved, would not start a preventative. Limit use of pain relievers to no more than 2 days out of week to prevent risk of rebound or medication-overuse headache. Keep headache diary Otherwise follow up in 6 months.   Total time spent in chart and face to face with patient:  46 minutes.   Subjective:  Darlene Ellis is a 38 year old right-handed female who follows up for headache and dizziness.  UPDATE: MRI of brain with and without contrast on 01/09/2023 personally reviewed revealed few tiny nonspecific T2 FLAIR hyperintensities within the subcortical cerebral white matter but otherwise unremarkable.  ***  She continues to have episodes of dizziness.  ***.  Seen in the ED on 4/27.  EKG normal and labs unremarkable.   Current NSAIDS/analgesics:  Tylenol  Current triptans:  none Current ergotamine:  none Current anti-emetic:  none Current muscle relaxants:  none Current Antihypertensive medications:  none Current Antidepressant medications:  none Current Anticonvulsant medications:  none Current anti-CGRP:  none Current Vitamins/Herbal/Supplements:  none Current Antihistamines/Decongestants:  none Other therapy:  none Birth control:  none   Caffeine:  Decaff coffee; stopped drinking caffeine Diet:  4 tumblers of water daily.  Has coffee or hot chocolate and bread in morning, eats lunch, eats a little rice at night (not too hungry) Exercise:  no routine exercise.  However, she  walks a lot with her children Depression:  no.  ; Anxiety:  improvedS Sleep hygiene:  sleeps from 9PM to 6AM.  Sleeps through the night but may wake up briefly once or twice.  Sometimes feels tired but overall feels rested.  HISTORY: She has had headaches on and off but they were never significant.  Over the summer she started experiencing a persistent headache which lasted for 3 months.  She endorses a persistent 2-3/10 low-grade pressure like headache.  It may be located across the front or back of head, both or either side of head.  Sometimes she will forget about it until she is reminded.  During this period of time, she may have episodes of nausea.  She also may have episodes of dizziness, difficult to describe but not vertigo (which she has had in the past).  It is not triggered by change in position and may occur while just standing in the kitchen and cooking.  Lasts just a few seconds.  Vision appeared "weird" but she couldn't elaborate. One time, it occurred while driving.  It would occur 2-3 times a week.  Sometimes, the headaches will fluctuate.  On 4 or 5 days out of the 3 months, the headaches increased to 7-8/10 in which she took Tylenol  and sleep it off.  Denies neck pain.  Jaw feels tired but no pain.  Denies increased stress or new medications.  It subsequently resolved in September.  She had an eye exam which was reportedly unremarkable.  This past week, she had an episode where she was driving and felt bilateral facial numbness.  She developed upper respiratory symptoms as well, facial pressure and nasal congestion.    Past  NSAIDS/analgesics:  none Past abortive triptans:  none Past abortive ergotamine:  none Past muscle relaxants:  none Past anti-emetic:  Reglan  Past antihypertensive medications:  none Past antidepressant medications:  none Past anticonvulsant medications:  none Past anti-CGRP:  none    Family history of headache:  she does not know  PAST MEDICAL  HISTORY: Past Medical History:  Diagnosis Date   Endometrial polyp    UTI (lower urinary tract infection)    Yeast infection     MEDICATIONS: Current Outpatient Medications on File Prior to Visit  Medication Sig Dispense Refill   loratadine  (CLARITIN ) 10 MG tablet TAKE 1 TABLET BY MOUTH EVERY DAY 90 tablet 1   rizatriptan  (MAXALT -MLT) 10 MG disintegrating tablet Take 1 tablet (10 mg total) by mouth as needed for migraine. May repeat in 2 hours if needed.  Maximum 2 tablets in 24 hours (Patient not taking: Reported on 05/03/2023) 9 tablet 11   No current facility-administered medications on file prior to visit.    ALLERGIES: Allergies  Allergen Reactions   Other     [place]    FAMILY HISTORY: Family History  Problem Relation Age of Onset   Other Mother        Hysterectomy   Hypertension Father    Breast cancer Maternal Aunt    Ovarian cancer Maternal Aunt    Colon cancer Maternal Aunt    Diabetes Paternal Grandmother       Objective:  *** General: No acute distress.  Patient appears ***-groomed.   Head:  Normocephalic/atraumatic Eyes:  Fundi examined but not visualized Neck: supple, no paraspinal tenderness, full range of motion Heart:  Regular rate and rhythm Lungs:  Clear to auscultation bilaterally Back: No paraspinal tenderness Neurological Exam: alert and oriented.  Speech fluent and not dysarthric, language intact.  CN II-XII intact. Bulk and tone normal, muscle strength 5/5 throughout.  Sensation to light touch intact.  Deep tendon reflexes 2+ throughout, toes downgoing.  Finger to nose testing intact.  Gait normal, Romberg negative.   Janne Members, DO  CC: Gavin Kast, FNP

## 2023-06-14 ENCOUNTER — Encounter: Payer: Self-pay | Admitting: Neurology

## 2023-06-14 ENCOUNTER — Ambulatory Visit: Admitting: Neurology

## 2023-06-14 VITALS — BP 120/81 | HR 71 | Ht 59.0 in | Wt 125.0 lb

## 2023-06-14 DIAGNOSIS — H81399 Other peripheral vertigo, unspecified ear: Secondary | ICD-10-CM | POA: Diagnosis not present

## 2023-06-14 DIAGNOSIS — G43009 Migraine without aura, not intractable, without status migrainosus: Secondary | ICD-10-CM | POA: Diagnosis not present

## 2023-06-14 NOTE — Patient Instructions (Signed)
 May use an over the counter analgesic for headaches, earliest onset of headache.  Rule of thumb is to Limit use of pain relievers to no more than 9 days out of the month to prevent risk of rebound or medication-overuse headache.

## 2023-09-09 ENCOUNTER — Encounter: Admitting: Internal Medicine

## 2023-09-12 ENCOUNTER — Ambulatory Visit (INDEPENDENT_AMBULATORY_CARE_PROVIDER_SITE_OTHER): Admitting: Internal Medicine

## 2023-09-12 ENCOUNTER — Encounter: Payer: Self-pay | Admitting: Internal Medicine

## 2023-09-12 VITALS — BP 106/68 | HR 57 | Temp 98.1°F | Ht 59.0 in | Wt 121.8 lb

## 2023-09-12 DIAGNOSIS — N644 Mastodynia: Secondary | ICD-10-CM | POA: Diagnosis not present

## 2023-09-12 DIAGNOSIS — Z0000 Encounter for general adult medical examination without abnormal findings: Secondary | ICD-10-CM | POA: Diagnosis not present

## 2023-09-12 LAB — CBC WITH DIFFERENTIAL/PLATELET
Basophils Absolute: 0 K/uL (ref 0.0–0.1)
Basophils Relative: 0.3 % (ref 0.0–3.0)
Eosinophils Absolute: 0.1 K/uL (ref 0.0–0.7)
Eosinophils Relative: 2 % (ref 0.0–5.0)
HCT: 42.2 % (ref 36.0–46.0)
Hemoglobin: 14 g/dL (ref 12.0–15.0)
Lymphocytes Relative: 41.1 % (ref 12.0–46.0)
Lymphs Abs: 2.1 K/uL (ref 0.7–4.0)
MCHC: 33.2 g/dL (ref 30.0–36.0)
MCV: 88.8 fl (ref 78.0–100.0)
Monocytes Absolute: 0.3 K/uL (ref 0.1–1.0)
Monocytes Relative: 6.1 % (ref 3.0–12.0)
Neutro Abs: 2.6 K/uL (ref 1.4–7.7)
Neutrophils Relative %: 50.5 % (ref 43.0–77.0)
Platelets: 239 K/uL (ref 150.0–400.0)
RBC: 4.75 Mil/uL (ref 3.87–5.11)
RDW: 13.2 % (ref 11.5–15.5)
WBC: 5.1 K/uL (ref 4.0–10.5)

## 2023-09-12 LAB — COMPREHENSIVE METABOLIC PANEL WITH GFR
ALT: 11 U/L (ref 0–35)
AST: 14 U/L (ref 0–37)
Albumin: 4.7 g/dL (ref 3.5–5.2)
Alkaline Phosphatase: 47 U/L (ref 39–117)
BUN: 8 mg/dL (ref 6–23)
CO2: 29 meq/L (ref 19–32)
Calcium: 9.2 mg/dL (ref 8.4–10.5)
Chloride: 102 meq/L (ref 96–112)
Creatinine, Ser: 0.61 mg/dL (ref 0.40–1.20)
GFR: 114.2 mL/min (ref >60.00)
Glucose, Bld: 85 mg/dL (ref 70–99)
Potassium: 3.7 meq/L (ref 3.5–5.1)
Sodium: 139 meq/L (ref 135–145)
Total Bilirubin: 1.3 mg/dL — ABNORMAL HIGH (ref 0.2–1.2)
Total Protein: 7.7 g/dL (ref 6.0–8.3)

## 2023-09-12 LAB — LIPID PANEL
Cholesterol: 214 mg/dL — ABNORMAL HIGH (ref 0–200)
HDL: 70.8 mg/dL (ref >39.00)
LDL Cholesterol: 128 mg/dL — ABNORMAL HIGH (ref 0–99)
NonHDL: 143.66
Total CHOL/HDL Ratio: 3
Triglycerides: 79 mg/dL (ref 0.0–149.0)
VLDL: 15.8 mg/dL (ref 0.0–40.0)

## 2023-09-12 LAB — TSH: TSH: 0.77 u[IU]/mL (ref 0.35–5.50)

## 2023-09-12 NOTE — Progress Notes (Signed)
 Subjective:   Darlene Ellis [birth year]  09/12/2023   CC: Chief Complaint  Patient presents with   Annual Exam    Fasting    tender breast    HPI: Darlene Ellis is a 38 y.o. female who presents for a routine health maintenance exam.  Labs  collected at time of visit.   Patient reports noticing an increase in bilateral breast tenderness.  She is currently on her period.  She is sexually active with 1 female partner.  Denies any nipple discharge or palpable lumps/masses.  She does report drinking approximately 4 cups of coffee per day.  Does admit to not drinking enough water per day.   HEALTH SCREENINGS: - Pap smear: done elsewhere - Mammogram (40+): Not applicable  - Colonoscopy (45+): Not applicable  - Bone Density (65+): Not applicable  - Lung CA screening with low-dose CT:  Not applicable Adults age 55-80 who are current cigarette smokers or quit within the last 15 years. Must have 20 pack year history.   IMMUNIZATIONS: - Tdap: Tetanus vaccination status reviewed: last tetanus booster within 10 years. - HPV: Up to date - [place] in [place] - Influenza: Postponed to flu season - Prevnar 20: Not applicable - Zostavax (50+): Not applicable   Past medical history, surgical history, medications, allergies, family history and social history reviewed with patient today and changes made to appropriate areas of the chart.   Social History   Socioeconomic History   Marital status: Married    Spouse name: Donny   Number of children: Not on file   Years of education: Not on file   Highest education level: Not on file  Occupational History   Not on file  Tobacco Use   Smoking status: Former    Types: Cigarettes   Smokeless tobacco: Never  Vaping Use   Vaping status: Never Used  Substance and Sexual Activity   Alcohol use: Yes    Comment: occas 1-2 times a month   Drug use: No   Sexual activity: Yes    Birth control/protection: Condom   Other Topics Concern   Not on file  Social History Narrative   Right Handed    Two storey- only one room on top floor   Social Drivers of Health   Financial Resource Strain: Not on file  Food Insecurity: Not on file  Transportation Needs: Not on file  Physical Activity: Not on file  Stress: Not on file  Social Connections: Not on file  Intimate Partner Violence: Not on file     Past Medical History:  Diagnosis Date   Endometrial polyp    UTI (lower urinary tract infection)    Yeast infection     Past Surgical History:  Procedure Laterality Date   DILATION AND CURETTAGE OF UTERUS      No current outpatient medications on file prior to visit.   No current facility-administered medications on file prior to visit.    Allergies  Allergen Reactions   Other     [place]    Family History  Problem Relation Age of Onset   Other Mother        Hysterectomy   Hypertension Father    Breast cancer Maternal Aunt    Ovarian cancer Maternal Aunt    Colon cancer Maternal Aunt    Diabetes Paternal Grandmother      ROS: Denies fever, fatigue, unexplained weight loss/gain, hearing or vision changes, cardiac or respiratory complaints. Denies neurological  deficits, musculoskeletal complaints, gastrointestinal or genitourinary complaints, mental health complaints, and skin changes.   Objective:   Today's Vitals   09/12/23 1353  BP: 106/68  Pulse: (!) 57  Temp: 98.1 F (36.7 C)  TempSrc: Temporal  SpO2: 97%  Weight: 121 lb 12.8 oz (55.2 kg)  Height: 4' 11 (1.499 m)    GENERAL APPEARANCE: Well-appearing, in NAD. Well nourished.  SKIN: Pink, warm and dry. Turgor normal. No rash, lesion, ulceration, or ecchymoses. Hair evenly distributed.  HEENT: HEAD: Normocephalic.  EYES: PERRLA. EOMI. Lids intact w/o defect. Sclera white, Conjunctiva pink w/o exudate.  EARS: External ear w/o redness, swelling, masses or lesions. EAC clear. TM's intact, translucent w/o bulging,  appropriate landmarks visualized. Appropriate acuity to conversational tones.  NOSE: Septum midline w/o deformity. Nares patent, mucosa pink and non-inflamed w/o drainage. THROAT: Uvula midline. Oropharynx clear. Tonsils non-inflamed w/o exudate . Oral mucosa pink and moist.  NECK: Supple, Trachea midline. Full ROM w/o pain or tenderness. No lymphadenopathy. Thyroid  non-tender w/o enlargement or palpable masses.  BREASTS: Breasts pendulous, symmetrical, and w/o palpable masses. Nipples everted and w/o discharge. No rash or skin retraction. No axillary or supraclavicular lymphadenopathy.  RESPIRATORY: Chest wall symmetrical w/o masses. Respirations even and non-labored. Breath sounds clear to auscultation bilaterally. No wheezes, rales, rhonchi, or crackles. CARDIAC: S1, S2 present, regular rate and rhythm. No gallops, murmurs, rubs, or clicks. Capillary refill <2 seconds. Peripheral pulses 2+ bilaterally. GI: Abdomen soft w/o distention. Normoactive bowel sounds. No palpable masses or tenderness. No guarding or rebound tenderness. Liver and spleen w/o tenderness or enlargement. No CVA tenderness.  MSK: Muscle tone and strength appropriate for age, w/o atrophy or abnormal movement.  EXTREMITIES: Active ROM intact, w/o tenderness, crepitus, or contracture. No obvious joint deformities or effusions. No clubbing, edema, or cyanosis.  NEUROLOGIC: CN's II-XII intact. Motor strength symmetrical with no obvious weakness. No sensory deficits. Steady, even gait.  PSYCH/MENTAL STATUS: Alert, oriented x 3. Cooperative, appropriate mood and affect.    Depression and Anxiety Screen done today and results listed below:     09/12/2023    1:59 PM 06/04/2023   10:31 AM 09/07/2022    9:27 AM 10/04/2020    1:51 PM 07/04/2020   10:19 AM  Depression screen PHQ 2/9  Decreased Interest 0 0 2 0 0  Down, Depressed, Hopeless 0 0 1 0 0  PHQ - 2 Score 0 0 3 0 0  Altered sleeping 1  1  1   Tired, decreased energy 1  2  1    Change in appetite 0  1  0  Feeling bad or failure about yourself  0  1  0  Trouble concentrating 0  0  0  Moving slowly or fidgety/restless 0  0  0  Suicidal thoughts 0  0  0  PHQ-9 Score 2  8  2   Difficult doing work/chores Not difficult at all  Somewhat difficult        09/12/2023    1:59 PM 09/07/2022    9:28 AM  GAD 7 : Generalized Anxiety Score  Nervous, Anxious, on Edge 0   Control/stop worrying 0 0  Worry too much - different things 0 0  Trouble relaxing 0 1  Restless 0 0  Easily annoyed or irritable 0 1  Afraid - awful might happen 0 0  Total GAD 7 Score 0   Anxiety Difficulty Not difficult at all Somewhat difficult     Assessment & Plan:  1. Encounter for general  adult medical examination without abnormal findings (Primary) - CBC with Differential/Platelet - Comprehensive metabolic panel with GFR - TSH - Lipid panel  2. Breast tenderness in female - Discussed reducing down caffeine consumption and increasing hydration - Discussed menstrual cycle can also cause increased breast tenderness - Patient is currently on menstrual cycle, she declines pregnancy test at this time.   Orders Placed This Encounter  Procedures   CBC with Differential/Platelet   Comprehensive metabolic panel with GFR   TSH   Lipid panel    PATIENT COUNSELING:  - Encouraged a healthy well-balanced diet. Patient may adjust caloric intake to maintain or achieve ideal body weight. May reduce intake of dietary saturated fat and total fat and have adequate dietary potassium and calcium preferably from fresh fruits, vegetables, and low-fat dairy products.   - Advised to avoid cigarette smoking. - Discussed with the patient that most people either abstain from alcohol or drink within safe limits (<=14/week and <=4 drinks/occasion for males, <=7/weeks and <= 3 drinks/occasion for females) and that the risk for alcohol disorders and other health effects rises proportionally with the number of drinks  per week and how often a drinker exceeds daily limits. - Discussed cessation/primary prevention of drug use and availability of treatment for abuse.  - Discussed sexually transmitted diseases, avoidance of unintended pregnancy and contraceptive alternatives.  - Stressed the importance of regular exercise - Injury prevention: Discussed safety belts, safety helmets, smoke detector, smoking near bedding or upholstery.  - Dental health: Discussed importance of regular tooth brushing, flossing, and dental visits.   NEXT PREVENTATIVE PHYSICAL DUE IN 1 YEAR.  Return in about 1 year (around 09/11/2024) for Annual Physical Exam with fasting lab work, and as needed.  Rosina Senters, FNP

## 2023-09-12 NOTE — Patient Instructions (Signed)
 Decrease caffeine intake and increase water consumption

## 2023-09-17 ENCOUNTER — Ambulatory Visit: Payer: Self-pay | Admitting: Internal Medicine

## 2024-01-02 NOTE — Progress Notes (Deleted)
 NEUROLOGY FOLLOW UP OFFICE NOTE  Darlene Ellis [id number]  Assessment/Plan:   Migraine without aura, without status migrainosus Peripheral vertigo, resolved   Headache preventative not indicated. When she has a headache, advised to take Tylenol  or other OTC analgesics at earliest onset. Limit use of pain relievers to no more than 9 days out of the month to prevent risk of rebound or medication-overuse headache. Keep headache diary Otherwise follow up in 6 months.   Total time spent in chart and face to face with patient:  ***   Subjective:  Darlene Ellis is a 38 year old right-handed female who follows up for headache and dizziness.  UPDATE: In May, headaches were manageable.  She has a 7/10 bilateral frontal-temporal headache with photophobia about 2 to 4 times month.  Usually doesn't take anything and will just go to bed.       Current NSAIDS/analgesics:  Tylenol  Current triptans:  none Current ergotamine:  none Current anti-emetic:  none Current muscle relaxants:  none Current Antihypertensive medications:  none Current Antidepressant medications:  none Current Anticonvulsant medications:  none Current anti-CGRP:  none Current Vitamins/Herbal/Supplements:  none Current Antihistamines/Decongestants:  none Other therapy:  none Birth control:  none   Caffeine:  Decaff coffee; stopped drinking caffeine Diet:  4 tumblers of water daily.  Has coffee or hot chocolate and bread in morning, eats lunch, eats a little rice at night (not too hungry) Exercise:  no routine exercise.  However, she walks a lot with her children Depression:  no.  ; Anxiety:  improvedS Sleep hygiene:  sleeps from 9PM to 6AM.  Sleeps through the night but may wake up briefly once or twice.  Sometimes feels tired but overall feels rested.  HISTORY: She has had headaches on and off but they were never significant.  Over the summer of 2024, she started experiencing a persistent  headache which lasted for 3 months.  She endorses a persistent 2-3/10 low-grade pressure like headache.  It may be located across the front or back of head, both or either side of head.  Sometimes she will forget about it until she is reminded.  During this period of time, she may have episodes of nausea.  She also may have episodes of dizziness, difficult to describe but not vertigo (which she has had in the past).  It is not triggered by change in position and may occur while just standing in the kitchen and cooking.  Lasts just a few seconds.  Vision appeared weird but she couldn't elaborate. One time, it occurred while driving.  It would occur 2-3 times a week.  Sometimes, the headaches will fluctuate.  On 4 or 5 days out of the 3 months, the headaches increased to 7-8/10 in which she took Tylenol  and sleep it off.  Denies neck pain.  Jaw feels tired but no pain.  Denies increased stress or new medications.  It subsequently resolved in September 2024.  She had an eye exam which was reportedly unremarkable.  The following month, she had an episode where she was driving and felt bilateral facial numbness.  She developed upper respiratory symptoms as well, facial pressure and nasal congestion.    MRI of brain with and without contrast on 01/09/2023 revealed few tiny nonspecific T2 FLAIR hyperintensities within the subcortical cerebral white matter but otherwise unremarkable.  On 05/26/2023, she had an episode of dizziness.  She was in the shower looking down when suddenly she felt lightheaded and then had  sensation of her head shaking side to side.  She felt palpitations.  Her Apple watch stated that her pulse was in the 80s.  She got out of the shower and sat down and had juice and cake in case it was due to low sugar.  She tried elevating her legs but it made her head feel heavy.  The severe dizziness lasted a couple of minutes, but for the next hour or so, she would note brief mild dizziness with movement.   She went to the ED where EKG and labs unremarkable.  She has not had a recurrence.    Past NSAIDS/analgesics:  none Past abortive triptans:  none Past abortive ergotamine:  none Past muscle relaxants:  none Past anti-emetic:  Reglan  Past antihypertensive medications:  none Past antidepressant medications:  none Past anticonvulsant medications:  none Past anti-CGRP:  none    Family history of headache:  she does not know  PAST MEDICAL HISTORY: Past Medical History:  Diagnosis Date   Endometrial polyp    UTI (lower urinary tract infection)    Yeast infection     MEDICATIONS: No current outpatient medications on file prior to visit.   No current facility-administered medications on file prior to visit.    ALLERGIES: Allergies  Allergen Reactions   Other     [place]    FAMILY HISTORY: Family History  Problem Relation Age of Onset   Other Mother        Hysterectomy   Hypertension Father    Breast cancer Maternal Aunt    Ovarian cancer Maternal Aunt    Colon cancer Maternal Aunt    Diabetes Paternal Grandmother       Objective:  *** General: No acute distress.  Patient appears well-groomed.   ***   Juliene Dunnings, DO  CC: Rosina Senters, FNP

## 2024-01-06 ENCOUNTER — Ambulatory Visit: Payer: Self-pay | Admitting: Neurology

## 2024-01-06 ENCOUNTER — Encounter: Payer: Self-pay | Admitting: Neurology

## 2024-09-14 ENCOUNTER — Encounter: Admitting: Internal Medicine
